# Patient Record
Sex: Male | Born: 1965 | Race: White | Hispanic: No | Marital: Single | Smoking: Current every day smoker
Health system: Southern US, Community
[De-identification: ages and names within clinical notes are randomized; demographics above are authoritative.]

## PROBLEM LIST (undated history)

## (undated) DIAGNOSIS — R531 Weakness: Secondary | ICD-10-CM

## (undated) DIAGNOSIS — Z8659 Personal history of other mental and behavioral disorders: Secondary | ICD-10-CM

## (undated) DIAGNOSIS — F1021 Alcohol dependence, in remission: Secondary | ICD-10-CM

## (undated) DIAGNOSIS — Z789 Other specified health status: Secondary | ICD-10-CM

## (undated) DIAGNOSIS — E43 Unspecified severe protein-calorie malnutrition: Secondary | ICD-10-CM

## (undated) DIAGNOSIS — I10 Essential (primary) hypertension: Secondary | ICD-10-CM

## (undated) DIAGNOSIS — R683 Clubbing of fingers: Secondary | ICD-10-CM

## (undated) DIAGNOSIS — R7989 Other specified abnormal findings of blood chemistry: Secondary | ICD-10-CM

## (undated) DIAGNOSIS — R251 Tremor, unspecified: Secondary | ICD-10-CM

## (undated) DIAGNOSIS — Z8719 Personal history of other diseases of the digestive system: Secondary | ICD-10-CM

## (undated) DIAGNOSIS — F1090 Alcohol use, unspecified, uncomplicated: Secondary | ICD-10-CM

## (undated) HISTORY — PX: TYMPANOSTOMY TUBE PLACEMENT: SHX32

---

## 2016-06-06 ENCOUNTER — Emergency Department: Payer: No Typology Code available for payment source

## 2016-06-06 ENCOUNTER — Encounter: Payer: Self-pay | Admitting: Emergency Medicine

## 2016-06-06 ENCOUNTER — Emergency Department
Admission: EM | Admit: 2016-06-06 | Discharge: 2016-06-06 | Disposition: A | Discharge status: Home / Self Care | Payer: No Typology Code available for payment source | Attending: Emergency Medicine | Admitting: Emergency Medicine

## 2016-06-06 DIAGNOSIS — S60221A Contusion of right hand, initial encounter: Secondary | ICD-10-CM

## 2016-06-06 DIAGNOSIS — Y999 Unspecified external cause status: Secondary | ICD-10-CM | POA: Insufficient documentation

## 2016-06-06 DIAGNOSIS — S20212A Contusion of left front wall of thorax, initial encounter: Secondary | ICD-10-CM | POA: Diagnosis not present

## 2016-06-06 DIAGNOSIS — S161XXA Strain of muscle, fascia and tendon at neck level, initial encounter: Secondary | ICD-10-CM | POA: Diagnosis not present

## 2016-06-06 DIAGNOSIS — S199XXA Unspecified injury of neck, initial encounter: Secondary | ICD-10-CM | POA: Diagnosis present

## 2016-06-06 DIAGNOSIS — Y92410 Unspecified street and highway as the place of occurrence of the external cause: Secondary | ICD-10-CM | POA: Diagnosis not present

## 2016-06-06 DIAGNOSIS — Y9389 Activity, other specified: Secondary | ICD-10-CM | POA: Diagnosis not present

## 2016-06-06 DIAGNOSIS — S40022A Contusion of left upper arm, initial encounter: Secondary | ICD-10-CM

## 2016-06-06 MED ORDER — HYDROCODONE-ACETAMINOPHEN 5-325 MG PO TABS: 1.0000 | ORAL_TABLET | Freq: Four times a day (QID) | ORAL | 0 refills | Status: DC | PRN

## 2016-06-06 MED ORDER — BACITRACIN ZINC 500 UNIT/GM EX OINT
TOPICAL_OINTMENT | CUTANEOUS | Status: AC
  Filled 2016-06-06: qty 0.9

## 2016-06-06 MED ORDER — IBUPROFEN 800 MG PO TABS: 800.0000 mg | ORAL_TABLET | Freq: Three times a day (TID) | ORAL | 0 refills | Status: DC | PRN

## 2016-06-06 MED ORDER — CYCLOBENZAPRINE HCL 5 MG PO TABS: 5.0000 mg | ORAL_TABLET | Freq: Three times a day (TID) | ORAL | 0 refills | Status: DC | PRN

## 2016-06-06 MED ORDER — BACITRACIN ZINC 500 UNIT/GM EX OINT
TOPICAL_OINTMENT | Freq: Two times a day (BID) | CUTANEOUS | Status: DC
Start: 2016-06-06 — End: 2016-06-06
  Administered 2016-06-06: 1 via TOPICAL

## 2016-06-06 MED ORDER — HYDROCODONE-ACETAMINOPHEN 5-325 MG PO TABS
1.0000 | ORAL_TABLET | ORAL | Status: AC
  Administered 2016-06-06: 1 via ORAL
  Filled 2016-06-06: qty 1
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
## 2016-06-06 NOTE — Discharge Instructions (Signed)
Please wear sling for 2-3 days. Take ibuprofen, Flexeril as needed for mild to moderate pain. Take Norco as needed for severe pain. Return to the ER for any increased swelling pain, numbness, tingling throughout the upper extremity. Return to the ER for any worsening symptoms urgent changes in her health.
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
## 2016-07-05 ENCOUNTER — Encounter: Payer: Self-pay | Admitting: Emergency Medicine

## 2016-07-05 ENCOUNTER — Emergency Department
Admission: EM | Admit: 2016-07-05 | Discharge: 2016-07-05 | Disposition: A | Discharge status: Home / Self Care | Payer: No Typology Code available for payment source | Attending: Emergency Medicine | Admitting: Emergency Medicine

## 2016-07-05 DIAGNOSIS — Z791 Long term (current) use of non-steroidal anti-inflammatories (NSAID): Secondary | ICD-10-CM | POA: Insufficient documentation

## 2016-07-05 DIAGNOSIS — S4992XD Unspecified injury of left shoulder and upper arm, subsequent encounter: Secondary | ICD-10-CM | POA: Diagnosis present

## 2016-07-05 DIAGNOSIS — S40022D Contusion of left upper arm, subsequent encounter: Secondary | ICD-10-CM | POA: Insufficient documentation

## 2016-07-05 DIAGNOSIS — F17210 Nicotine dependence, cigarettes, uncomplicated: Secondary | ICD-10-CM | POA: Insufficient documentation

## 2016-07-05 MED ORDER — NABUMETONE 750 MG PO TABS: 750.0000 mg | ORAL_TABLET | Freq: Two times a day (BID) | ORAL | 0 refills | Status: DC

## 2016-07-05 NOTE — Discharge Instructions (Signed)
Your exam and review of your previous CT scan and x-rays reveals a small, resolving hematoma to the upper left arm. This happens after a deep muscle bruise. It can take several weeks for a deep muscle bruise and hematoma to go away. Apply warm compresses to promote healing. Take the prescription anti-inflammatory pain medicine as directed. Follow-up with Dr. Joice LoftsPoggi for continued symptoms.

## 2016-07-05 NOTE — ED Notes (Signed)
Pt states he used to be BP meds xfew years ago but hasn't been on any since.
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
## 2022-11-08 ENCOUNTER — Encounter: Payer: Self-pay | Admitting: Radiology

## 2022-11-08 ENCOUNTER — Inpatient Hospital Stay
Admission: EM | Admit: 2022-11-08 | Discharge: 2022-11-14 | DRG: 896 | Disposition: A | Discharge status: Home Health | Payer: 59 | Attending: Internal Medicine | Admitting: Internal Medicine

## 2022-11-08 ENCOUNTER — Inpatient Hospital Stay: Payer: 59

## 2022-11-08 ENCOUNTER — Emergency Department: Payer: 59

## 2022-11-08 DIAGNOSIS — R251 Tremor, unspecified: Secondary | ICD-10-CM | POA: Diagnosis not present

## 2022-11-08 DIAGNOSIS — F10931 Alcohol use, unspecified with withdrawal delirium: Secondary | ICD-10-CM

## 2022-11-08 DIAGNOSIS — Z87891 Personal history of nicotine dependence: Secondary | ICD-10-CM

## 2022-11-08 DIAGNOSIS — Z6825 Body mass index (BMI) 25.0-25.9, adult: Secondary | ICD-10-CM

## 2022-11-08 DIAGNOSIS — L659 Nonscarring hair loss, unspecified: Secondary | ICD-10-CM | POA: Diagnosis present

## 2022-11-08 DIAGNOSIS — R683 Clubbing of fingers: Secondary | ICD-10-CM | POA: Insufficient documentation

## 2022-11-08 DIAGNOSIS — R748 Abnormal levels of other serum enzymes: Secondary | ICD-10-CM | POA: Diagnosis not present

## 2022-11-08 DIAGNOSIS — R531 Weakness: Principal | ICD-10-CM

## 2022-11-08 DIAGNOSIS — Y906 Blood alcohol level of 120-199 mg/100 ml: Secondary | ICD-10-CM | POA: Diagnosis present

## 2022-11-08 DIAGNOSIS — F1010 Alcohol abuse, uncomplicated: Secondary | ICD-10-CM | POA: Diagnosis not present

## 2022-11-08 DIAGNOSIS — G312 Degeneration of nervous system due to alcohol: Secondary | ICD-10-CM | POA: Diagnosis not present

## 2022-11-08 DIAGNOSIS — G9341 Metabolic encephalopathy: Secondary | ICD-10-CM | POA: Diagnosis present

## 2022-11-08 DIAGNOSIS — R0602 Shortness of breath: Secondary | ICD-10-CM | POA: Diagnosis not present

## 2022-11-08 DIAGNOSIS — R4182 Altered mental status, unspecified: Secondary | ICD-10-CM | POA: Diagnosis present

## 2022-11-08 DIAGNOSIS — E43 Unspecified severe protein-calorie malnutrition: Secondary | ICD-10-CM | POA: Insufficient documentation

## 2022-11-08 DIAGNOSIS — Z79899 Other long term (current) drug therapy: Secondary | ICD-10-CM

## 2022-11-08 DIAGNOSIS — M5000 Cervical disc disorder with myelopathy, unspecified cervical region: Secondary | ICD-10-CM | POA: Diagnosis not present

## 2022-11-08 DIAGNOSIS — R260 Ataxic gait: Secondary | ICD-10-CM | POA: Diagnosis not present

## 2022-11-08 DIAGNOSIS — E559 Vitamin D deficiency, unspecified: Secondary | ICD-10-CM | POA: Diagnosis present

## 2022-11-08 DIAGNOSIS — R1310 Dysphagia, unspecified: Secondary | ICD-10-CM | POA: Diagnosis not present

## 2022-11-08 DIAGNOSIS — R630 Anorexia: Secondary | ICD-10-CM | POA: Diagnosis present

## 2022-11-08 DIAGNOSIS — K760 Fatty (change of) liver, not elsewhere classified: Secondary | ICD-10-CM | POA: Diagnosis not present

## 2022-11-08 DIAGNOSIS — I10 Essential (primary) hypertension: Secondary | ICD-10-CM | POA: Diagnosis not present

## 2022-11-08 DIAGNOSIS — G4700 Insomnia, unspecified: Secondary | ICD-10-CM | POA: Diagnosis not present

## 2022-11-08 DIAGNOSIS — R079 Chest pain, unspecified: Secondary | ICD-10-CM | POA: Diagnosis not present

## 2022-11-08 DIAGNOSIS — L631 Alopecia universalis: Secondary | ICD-10-CM | POA: Diagnosis not present

## 2022-11-08 DIAGNOSIS — K7010 Alcoholic hepatitis without ascites: Secondary | ICD-10-CM | POA: Diagnosis present

## 2022-11-08 DIAGNOSIS — G8191 Hemiplegia, unspecified affecting right dominant side: Secondary | ICD-10-CM | POA: Diagnosis not present

## 2022-11-08 DIAGNOSIS — F1021 Alcohol dependence, in remission: Secondary | ICD-10-CM

## 2022-11-08 DIAGNOSIS — G629 Polyneuropathy, unspecified: Secondary | ICD-10-CM | POA: Diagnosis not present

## 2022-11-08 DIAGNOSIS — F10231 Alcohol dependence with withdrawal delirium: Principal | ICD-10-CM | POA: Diagnosis present

## 2022-11-08 DIAGNOSIS — Z789 Other specified health status: Secondary | ICD-10-CM | POA: Insufficient documentation

## 2022-11-08 DIAGNOSIS — R5383 Other fatigue: Secondary | ICD-10-CM | POA: Diagnosis not present

## 2022-11-08 DIAGNOSIS — R17 Unspecified jaundice: Secondary | ICD-10-CM | POA: Diagnosis not present

## 2022-11-08 DIAGNOSIS — M545 Low back pain, unspecified: Secondary | ICD-10-CM | POA: Diagnosis not present

## 2022-11-08 HISTORY — DX: Other specified health status: Z78.9

## 2022-11-08 HISTORY — DX: Alcohol use, unspecified, uncomplicated: F10.90

## 2022-11-08 LAB — URINE DRUG SCREEN, QUALITATIVE (ARMC ONLY)
Amphetamines, Ur Screen: NOT DETECTED
Barbiturates, Ur Screen: NOT DETECTED
Benzodiazepine, Ur Scrn: NOT DETECTED
Cannabinoid 50 Ng, Ur ~~LOC~~: POSITIVE — AB
Cocaine Metabolite,Ur ~~LOC~~: NOT DETECTED
MDMA (Ecstasy)Ur Screen: NOT DETECTED
Methadone Scn, Ur: NOT DETECTED
Opiate, Ur Screen: NOT DETECTED
Phencyclidine (PCP) Ur S: NOT DETECTED
Tricyclic, Ur Screen: NOT DETECTED

## 2022-11-08 LAB — URINALYSIS, ROUTINE W REFLEX MICROSCOPIC
Bacteria, UA: NONE SEEN
Bilirubin Urine: NEGATIVE
Glucose, UA: NEGATIVE mg/dL
Ketones, ur: 5 mg/dL — AB
Leukocytes,Ua: NEGATIVE
Nitrite: NEGATIVE
Protein, ur: 30 mg/dL — AB
Specific Gravity, Urine: 1.027 (ref 1.005–1.030)
Squamous Epithelial / HPF: NONE SEEN /HPF (ref 0–5)
pH: 5 (ref 5.0–8.0)

## 2022-11-08 LAB — BASIC METABOLIC PANEL
Anion gap: 15 (ref 5–15)
BUN: 9 mg/dL (ref 6–20)
CO2: 21 mmol/L — ABNORMAL LOW (ref 22–32)
Calcium: 8.8 mg/dL — ABNORMAL LOW (ref 8.9–10.3)
Chloride: 102 mmol/L (ref 98–111)
Creatinine, Ser: 0.65 mg/dL (ref 0.61–1.24)
GFR, Estimated: >60 mL/min (ref >60)
Glucose, Bld: 94 mg/dL (ref 70–99)
Potassium: 4.2 mmol/L (ref 3.5–5.1)
Sodium: 138 mmol/L (ref 135–145)

## 2022-11-08 LAB — CBC
HCT: 45.8 % (ref 39.0–52.0)
Hemoglobin: 15.4 g/dL (ref 13.0–17.0)
MCH: 33.7 pg (ref 26.0–34.0)
MCHC: 33.6 g/dL (ref 30.0–36.0)
MCV: 100.2 fL — ABNORMAL HIGH (ref 80.0–100.0)
Platelets: 283 10*3/uL (ref 150–400)
RBC: 4.57 MIL/uL (ref 4.22–5.81)
RDW: 14.6 % (ref 11.5–15.5)
WBC: 6.7 10*3/uL (ref 4.0–10.5)
nRBC: 0 % (ref 0.0–0.2)

## 2022-11-08 LAB — HEPATIC FUNCTION PANEL
ALT: 85 U/L — ABNORMAL HIGH (ref 0–44)
AST: 114 U/L — ABNORMAL HIGH (ref 15–41)
Albumin: 4.6 g/dL (ref 3.5–5.0)
Alkaline Phosphatase: 76 U/L (ref 38–126)
Bilirubin, Direct: 0.3 mg/dL — ABNORMAL HIGH (ref 0.0–0.2)
Indirect Bilirubin: 0.8 mg/dL (ref 0.3–0.9)
Total Bilirubin: 1.1 mg/dL (ref 0.3–1.2)
Total Protein: 8.4 g/dL — ABNORMAL HIGH (ref 6.5–8.1)

## 2022-11-08 LAB — RETICULOCYTES
Immature Retic Fract: 8.6 % (ref 2.3–15.9)
RBC.: 4.01 MIL/uL — ABNORMAL LOW (ref 4.22–5.81)
Retic Count, Absolute: 89 10*3/uL (ref 19.0–186.0)
Retic Ct Pct: 2.2 % (ref 0.4–3.1)

## 2022-11-08 LAB — T4, FREE: Free T4: 0.53 ng/dL — ABNORMAL LOW (ref 0.61–1.12)

## 2022-11-08 LAB — TROPONIN I (HIGH SENSITIVITY): Troponin I (High Sensitivity): 15 ng/L (ref <18)

## 2022-11-08 LAB — FOLATE: Folate: 15.5 ng/mL (ref >5.9)

## 2022-11-08 LAB — AMMONIA: Ammonia: 29 umol/L (ref 9–35)

## 2022-11-08 LAB — PHOSPHORUS: Phosphorus: 3.8 mg/dL (ref 2.5–4.6)

## 2022-11-08 LAB — TSH: TSH: 2.097 u[IU]/mL (ref 0.350–4.500)

## 2022-11-08 LAB — CK: Total CK: 101 U/L (ref 49–397)

## 2022-11-08 LAB — VITAMIN B12: Vitamin B-12: 375 pg/mL (ref 180–914)

## 2022-11-08 LAB — IRON AND TIBC
Iron: 119 ug/dL (ref 45–182)
Saturation Ratios: 29 % (ref 17.9–39.5)
TIBC: 407 ug/dL (ref 250–450)
UIBC: 288 ug/dL

## 2022-11-08 LAB — FERRITIN: Ferritin: 126 ng/mL (ref 24–336)

## 2022-11-08 LAB — ETHANOL: Alcohol, Ethyl (B): 195 mg/dL — ABNORMAL HIGH (ref <10)

## 2022-11-08 LAB — MAGNESIUM: Magnesium: 2.2 mg/dL (ref 1.7–2.4)

## 2022-11-08 MED ORDER — IOHEXOL 350 MG/ML SOLN
75.0000 mL | Freq: Once | INTRAVENOUS | Status: AC | PRN
  Administered 2022-11-08: 75 mL via INTRAVENOUS

## 2022-11-08 MED ORDER — LORAZEPAM 1 MG PO TABS
1.0000 mg | ORAL_TABLET | ORAL | Status: AC | PRN
  Administered 2022-11-09: 2 mg via ORAL
  Administered 2022-11-10 – 2022-11-11 (×4): 1 mg via ORAL
  Filled 2022-11-08: qty 1
  Filled 2022-11-08: qty 2
  Filled 2022-11-08 (×3): qty 1

## 2022-11-08 MED ORDER — GADOBUTROL 1 MMOL/ML IV SOLN
7.0000 mL | Freq: Once | INTRAVENOUS | Status: AC | PRN
  Administered 2022-11-08: 7 mL via INTRAVENOUS

## 2022-11-08 MED ORDER — ENOXAPARIN SODIUM 40 MG/0.4ML IJ SOSY
40.0000 mg | PREFILLED_SYRINGE | INTRAMUSCULAR | Status: DC
  Administered 2022-11-08 – 2022-11-13 (×6): 40 mg via SUBCUTANEOUS
  Filled 2022-11-08 (×6): qty 0.4

## 2022-11-08 MED ORDER — THIAMINE HCL 100 MG/ML IJ SOLN
500.0000 mg | Freq: Once | INTRAVENOUS | Status: AC
  Administered 2022-11-08: 500 mg via INTRAVENOUS
  Filled 2022-11-08: qty 5

## 2022-11-08 MED ORDER — ADULT MULTIVITAMIN W/MINERALS CH
1.0000 | ORAL_TABLET | Freq: Every day | ORAL | Status: DC
  Administered 2022-11-08 – 2022-11-14 (×7): 1 via ORAL
  Filled 2022-11-08 (×7): qty 1

## 2022-11-08 MED ORDER — LORAZEPAM 2 MG/ML IJ SOLN
1.0000 mg | Freq: Once | INTRAMUSCULAR | Status: AC
  Administered 2022-11-08: 1 mg via INTRAVENOUS
  Filled 2022-11-08: qty 1

## 2022-11-08 MED ORDER — THIAMINE MONONITRATE 100 MG PO TABS
100.0000 mg | ORAL_TABLET | Freq: Every day | ORAL | Status: DC
  Administered 2022-11-09: 100 mg via ORAL
  Filled 2022-11-08: qty 1

## 2022-11-08 MED ORDER — SENNOSIDES-DOCUSATE SODIUM 8.6-50 MG PO TABS: 1.0000 | ORAL_TABLET | Freq: Every evening | ORAL | Status: DC | PRN

## 2022-11-08 MED ORDER — ACETAMINOPHEN 650 MG RE SUPP: 650.0000 mg | Freq: Four times a day (QID) | RECTAL | Status: AC | PRN

## 2022-11-08 MED ORDER — ONDANSETRON HCL 4 MG PO TABS
4.0000 mg | ORAL_TABLET | Freq: Four times a day (QID) | ORAL | Status: AC | PRN
  Administered 2022-11-10 – 2022-11-12 (×5): 4 mg via ORAL
  Filled 2022-11-08 (×5): qty 1

## 2022-11-08 MED ORDER — ONDANSETRON HCL 4 MG/2ML IJ SOLN
4.0000 mg | Freq: Four times a day (QID) | INTRAMUSCULAR | Status: AC | PRN
  Administered 2022-11-10 – 2022-11-13 (×2): 4 mg via INTRAVENOUS
  Filled 2022-11-08 (×2): qty 2

## 2022-11-08 MED ORDER — CYANOCOBALAMIN 1000 MCG/ML IJ SOLN
1000.0000 ug | Freq: Once | INTRAMUSCULAR | Status: AC
  Administered 2022-11-08: 1000 ug via INTRAMUSCULAR
  Filled 2022-11-08: qty 1

## 2022-11-08 MED ORDER — LORAZEPAM 2 MG/ML IJ SOLN
1.0000 mg | INTRAMUSCULAR | Status: AC | PRN
  Administered 2022-11-08: 2 mg via INTRAVENOUS
  Administered 2022-11-08 – 2022-11-10 (×2): 1 mg via INTRAVENOUS
  Filled 2022-11-08 (×3): qty 1

## 2022-11-08 MED ORDER — THIAMINE HCL 100 MG/ML IJ SOLN: 100.0000 mg | Freq: Every day | INTRAMUSCULAR | Status: DC

## 2022-11-08 MED ORDER — VITAMIN C 500 MG PO TABS
250.0000 mg | ORAL_TABLET | Freq: Two times a day (BID) | ORAL | Status: DC
  Administered 2022-11-08 – 2022-11-14 (×12): 250 mg via ORAL
  Filled 2022-11-08 (×12): qty 1

## 2022-11-08 MED ORDER — SODIUM CHLORIDE 0.9 % IV BOLUS
1000.0000 mL | Freq: Once | INTRAVENOUS | Status: AC
  Administered 2022-11-08: 1000 mL via INTRAVENOUS

## 2022-11-08 MED ORDER — ACETAMINOPHEN 325 MG PO TABS
650.0000 mg | ORAL_TABLET | Freq: Four times a day (QID) | ORAL | Status: AC | PRN
  Administered 2022-11-09 – 2022-11-13 (×7): 650 mg via ORAL
  Filled 2022-11-08 (×7): qty 2

## 2022-11-08 MED ORDER — FOLIC ACID 1 MG PO TABS
1.0000 mg | ORAL_TABLET | Freq: Every day | ORAL | Status: DC
  Administered 2022-11-08 – 2022-11-14 (×7): 1 mg via ORAL
  Filled 2022-11-08 (×7): qty 1

## 2022-11-08 NOTE — ED Notes (Signed)
Pt almost finished with dinner tray. Confirms has answered MRI screening questions.

## 2022-11-08 NOTE — Hospital Course (Addendum)
[name] is a 57 year old male with history of generalized muscle weakness, tremor, who presents emergency department for chief concerns of worsening tremors and complete hair loss over the last month.  Tremor was worse on the right side with some weakness.  MRI of the brain did not show any acute changes.  MRI neck did not show any cervical stenosis. 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 Patient also has been seen by neurology for right sided weakness, given total hair loss, workup started. 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  Can discharge and follow-up with neurology as outpatient.  However, due to severe weakness, PT recommended nursing home placement.  4/10.  Patient's strength has gradually improved.  Only 1 rehab accepted him as a patient but the insurance does not cover that rehab.  Patient has decided to go home with home health.  Less tremor at this point.  Patient knows he must stop alcohol.

## 2022-11-08 NOTE — H&P (Addendum)
History and Physical   Lee Mueller [id number] DOB: 1966/08/05 DOA: 11/08/2022  PCP: Pcp, No  Patient coming from: [facility] urgent care  I have personally briefly reviewed patient's old medical records in [place].  Chief Concern: Right-sided weakness, insomnia, complete hair loss  HPI: [name] is a 57 year old male with history of generalized muscle weakness, tremor, who presents emergency department for chief concerns of worsening tremors and complete hair loss over the last month.  Vitals show temperature of 98.1, respiration rate of 17, heart rate 88, blood pressure 184/105, SpO2 of 95% on room air.  Serum sodium is 138, potassium 4.2, chloride 102, bicarb 21, BUN of 9, serum creatinine of 0.65, EGFR greater than 60, nonfasting blood glucose 95, WBC 6.7, hemoglobin 15.4, platelets of 283.  High sensitive troponin is 15, folic acid level was 0000000.  Patient had AST levels of 114, ALT is 85.  EDP consulted neurology who recommends checking ammonia, vitamin D, folate, B1 level, B12 level, TSH and free T4 and adding a hepatic function panel.  ED treatment: Ativan 1 mg IV one-time dose, sodium chloride 1 L bolus, thiamine 500 mg IV one-time dose. --------------------- At bedside, patient was able to tell me his name, age, current calendar year and current location.  Patient is completely hairless and there is no hair on his arms, legs, head.  He has no eyebrows.  Ports that over the last 2 weeks he has had difficulty walking, with right-sided weakness upper and lower extremities.  He reports he can walk but he is limping.  He denies trauma to his person.  In addition, he states that over the last month he has had diffuse hair loss along with the end of his digit widening.  He reports over the last month he has had poor p.o. intake, lack of appetite.  He reports that he may have lost about 5 to 6 pounds over the last month.  He does not know if he is lost weight  over the last 6 months or year.  He denies any changes to his job and/or living situation.  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  He currently lives in his apartment complex for the last 5 years.  He states he compartment complex may have been 57 years old.  He denies any significant changes over the last 6 months.  Social history: He lives alone.  He denies tobacco and recreational drug use.  He drinks beer almost every day, 1-2 beers per day.  He states they are 12 ounce bottles.  ROS: Constitutional: + weight change, no fever, + hair loss ENT/Mouth: no sore throat, no rhinorrhea Eyes: no eye pain, no vision changes Cardiovascular: no chest pain, no dyspnea,  no edema, no palpitations Respiratory: no cough, no sputum, no wheezing Gastrointestinal: no nausea, no vomiting, no diarrhea, no constipation Genitourinary: no urinary incontinence, no dysuria, no hematuria Musculoskeletal: no arthralgias, no myalgias Skin: no skin lesions, no pruritus, clubbing of his fingers Neuro: + weakness (R> L) eyes normal, no loss of consciousness, no syncope Psych: no anxiety, no depression, + decrease appetite Heme/Lymph: no bruising, no bleeding  ED Course: Discussed with emergency medicine provider, patient requiring hospitalization for chief concerns of altered mental status, tremors, complete hair loss and clubbing of the digits.  Assessment/Plan  Principal Problem:   Altered mental status Active Problems:   Complete loss of hair  Hair loss   Finger clubbing   Alcohol use   Elevated liver enzymes   Tremor of both hands   Assessment and Plan:  * Altered mental status Etiology workup in progress with tremors CT abdomen and pelvis with contrast and CTA of the chest to assess for PE were negative for lymphadenopathy and remarkable for hepatic steatosis Checking ammonia level, B1, [id number], folic acid level, vitamin D, UDS, ethanol  level Added phosphorus, vitamin D, lead level  Complete loss of hair Check for heavy metal poisoning, lead, copper, iron, ferritin level in the adult, vitamin D level, phosphorus Check free and total testosterone level  Tremor of both hands At rest Given daily alcohol use, we will initiate CIWA precaution  Elevated liver enzymes In setting of hepatic steatosis Patient has an approximate 2:1 ratio of AST: ALT, however given complete hair loss, heavy metal toxicity cannot be excluded at this time Check iron level, ferritin, copper, lead serum level LFTs recheck in the a.m.  Alcohol use With tremors, CIWA precaution initiated at this time Admit to PCU  Finger clubbing Check for heavy metal poisoning in an adult, lead level ordered  Chart reviewed.   DVT prophylaxis: Enoxaparin Code Status: full code Diet: Heart healthy Family Communication: A phone call was offered, patient declined Disposition Plan: Pending clinical course Consults called: EDP consulted neurology Admission status: Telemetry medical, inpatient  Past Medical History:  Diagnosis Date   Alcohol use    History reviewed. No pertinent surgical history.  Social History:  reports that he has quit smoking. His smoking use included cigarettes. He smoked an average of .5 packs per day. He has never used smokeless tobacco. He reports current alcohol use. He reports that he does not use drugs.  No Known Allergies Family History  Problem Relation Age of Onset   Breast cancer Mother    Family history: Family history reviewed and not pertinent.  Prior to Admission medications   Medication Sig Start Date End Date Taking? Authorizing Provider  cyclobenzaprine (FLEXERIL) 5 MG tablet Take 1-2 tablets (5-10 mg total) by mouth 3 (three) times daily as needed for muscle spasms. 06/06/16   Duanne Guess, PA-C  HYDROcodone-acetaminophen (NORCO) 5-325 MG tablet Take 1 tablet by mouth every 6 (six) hours as needed for  moderate pain. 06/06/16   Duanne Guess, PA-C  ibuprofen (ADVIL,MOTRIN) 800 MG tablet Take 1 tablet (800 mg total) by mouth every 8 (eight) hours as needed. 06/06/16   Duanne Guess, PA-C  nabumetone (RELAFEN) 750 MG tablet Take 1 tablet (750 mg total) by mouth 2 (two) times daily. 07/05/16   Menshew, Dannielle Karvonen, PA-C   Physical Exam: Vitals:   11/08/22 1800 11/08/22 1830 11/08/22 1901 11/08/22 1902  BP:  (!) 147/92    Pulse: 75 74  100  Resp: 18 16 19  (!) 21  Temp:      TempSrc:      SpO2: 97% 95%  99%  Weight:       Constitutional: appears older than chronological age, frail NAD, calm, comfortable. Patient is hairless Eyes: PERRL, lids and conjunctivae normal ENMT: Mucous membranes are moist. Posterior pharynx clear of any exudate or lesions. Age-appropriate dentition. Hearing appropriate Neck: normal, supple, no masses, no thyromegaly Respiratory: clear to auscultation bilaterally, no wheezing, no crackles. Normal respiratory effort. No accessory muscle use.  Cardiovascular: Regular rate and rhythm, no murmurs / rubs / gallops. No extremity edema. 2+ pedal pulses. No carotid bruits.  Diffuse clubbing of  his fingers  Abdomen: no tenderness, no masses palpated, no hepatosplenomegaly. Bowel sounds positive.  Musculoskeletal: no clubbing / cyanosis. No joint deformity upper and lower extremities. Good ROM, no contractures, no atrophy. Normal muscle tone.  Skin: no rashes, lesions, ulcers. No induration. Patient is hairless Neurologic: Sensation intact. Strength 5/5 in all 4.  Psychiatric: Normal judgment and insight. Alert and oriented x 3. Normal mood.   EKG: independently reviewed, showing sinus rhythm with rate of 78, QTc 444  Chest x-ray on Admission: I personally reviewed and I agree with radiologist reading as below.  CT Angio Chest PE W and/or Wo Contrast  Result Date: 11/08/2022 CLINICAL DATA:  Evaluation for new onset tremors, fatigue, muscle weakness, and difficulty  focusing for 4-6 weeks. PE suspected. Acute nonlocalized abdominal pain. EXAM: CT ANGIOGRAPHY CHEST CT ABDOMEN AND PELVIS WITH CONTRAST TECHNIQUE: Multidetector CT imaging of the chest was performed using the standard protocol during bolus administration of intravenous contrast. Multiplanar CT image reconstructions and MIPs were obtained to evaluate the vascular anatomy. Multidetector CT imaging of the abdomen and pelvis was performed using the standard protocol during bolus administration of intravenous contrast. RADIATION DOSE REDUCTION: This exam was performed according to the departmental dose-optimization program which includes automated exposure control, adjustment of the mA and/or kV according to patient size and/or use of iterative reconstruction technique. CONTRAST:  72mL OMNIPAQUE IOHEXOL 350 MG/ML SOLN COMPARISON:  Chest radiographs 11/08/2022 FINDINGS: CTA CHEST FINDINGS Cardiovascular: Satisfactory opacification of the pulmonary arteries to the segmental level. No evidence of pulmonary embolism. Normal heart size. No pericardial effusion. Mediastinum/Nodes: No enlarged mediastinal, hilar, or axillary lymph nodes. Thyroid gland, trachea, and esophagus demonstrate no significant findings. Lungs/Pleura: Mild emphysema in the upper lungs. No focal consolidation, pleural effusion, or pneumothorax. Biapical pleural-parenchymal scarring. Musculoskeletal: No chest wall abnormality. No acute osseous findings. Review of the MIP images confirms the above findings. CT ABDOMEN and PELVIS FINDINGS Hepatobiliary: Hepatic steatosis. Unremarkable gallbladder and biliary tree. Pancreas: Unremarkable. Spleen: Unremarkable. Adrenals/Urinary Tract: Normal adrenal glands. Low-attenuation lesions in the kidneys are statistically likely to represent cysts. No follow-up is required. No urinary calculi or hydronephrosis. Unremarkable bladder. Stomach/Bowel: Stomach is within normal limits. Appendix appears normal. No evidence of  bowel wall thickening, distention, or inflammatory changes. Vascular/Lymphatic: Aortic atherosclerosis. No enlarged abdominal or pelvic lymph nodes. Reproductive: Enlarged prostate. Other: No free intraperitoneal fluid or air. Musculoskeletal: No acute or significant osseous findings. IMPRESSION: 1. Negative for acute pulmonary embolism. 2. No acute abnormality in the chest, abdomen, or pelvis. 3. Hepatic steatosis. 4. Enlarged prostate. 5.  Aortic Atherosclerosis (ICD10-I70.0). Electronically Signed   By: Placido Sou M.D.   On: 11/08/2022 17:56   CT Abdomen Pelvis W Contrast  Result Date: 11/08/2022 CLINICAL DATA:  Evaluation for new onset tremors, fatigue, muscle weakness, and difficulty focusing for 4-6 weeks. PE suspected. Acute nonlocalized abdominal pain. EXAM: CT ANGIOGRAPHY CHEST CT ABDOMEN AND PELVIS WITH CONTRAST TECHNIQUE: Multidetector CT imaging of the chest was performed using the standard protocol during bolus administration of intravenous contrast. Multiplanar CT image reconstructions and MIPs were obtained to evaluate the vascular anatomy. Multidetector CT imaging of the abdomen and pelvis was performed using the standard protocol during bolus administration of intravenous contrast. RADIATION DOSE REDUCTION: This exam was performed according to the departmental dose-optimization program which includes automated exposure control, adjustment of the mA and/or kV according to patient size and/or use of iterative reconstruction technique. CONTRAST:  32mL OMNIPAQUE IOHEXOL 350 MG/ML SOLN COMPARISON:  Chest radiographs 11/08/2022 FINDINGS:  CTA CHEST FINDINGS Cardiovascular: Satisfactory opacification of the pulmonary arteries to the segmental level. No evidence of pulmonary embolism. Normal heart size. No pericardial effusion. Mediastinum/Nodes: No enlarged mediastinal, hilar, or axillary lymph nodes. Thyroid gland, trachea, and esophagus demonstrate no significant findings. Lungs/Pleura: Mild  emphysema in the upper lungs. No focal consolidation, pleural effusion, or pneumothorax. Biapical pleural-parenchymal scarring. Musculoskeletal: No chest wall abnormality. No acute osseous findings. Review of the MIP images confirms the above findings. CT ABDOMEN and PELVIS FINDINGS Hepatobiliary: Hepatic steatosis. Unremarkable gallbladder and biliary tree. Pancreas: Unremarkable. Spleen: Unremarkable. Adrenals/Urinary Tract: Normal adrenal glands. Low-attenuation lesions in the kidneys are statistically likely to represent cysts. No follow-up is required. No urinary calculi or hydronephrosis. Unremarkable bladder. Stomach/Bowel: Stomach is within normal limits. Appendix appears normal. No evidence of bowel wall thickening, distention, or inflammatory changes. Vascular/Lymphatic: Aortic atherosclerosis. No enlarged abdominal or pelvic lymph nodes. Reproductive: Enlarged prostate. Other: No free intraperitoneal fluid or air. Musculoskeletal: No acute or significant osseous findings. IMPRESSION: 1. Negative for acute pulmonary embolism. 2. No acute abnormality in the chest, abdomen, or pelvis. 3. Hepatic steatosis. 4. Enlarged prostate. 5.  Aortic Atherosclerosis (ICD10-I70.0). Electronically Signed   By: Placido Sou M.D.   On: 11/08/2022 17:56   DG Chest 2 View  Result Date: 11/08/2022 CLINICAL DATA:  CP, SOB EXAM: CHEST - 2 VIEW COMPARISON:  06/06/2016 FINDINGS: Cardiac silhouette is unremarkable. No pneumothorax or pleural effusion. The lungs are clear. The visualized skeletal structures are unremarkable. IMPRESSION: No acute cardiopulmonary process. Electronically Signed   By: Sammie Bench M.D.   On: 11/08/2022 16:49   CT Head Wo Contrast  Result Date: 11/08/2022 CLINICAL DATA:  Mental status change, unknown cause. EXAM: CT HEAD WITHOUT CONTRAST TECHNIQUE: Contiguous axial images were obtained from the base of the skull through the vertex without intravenous contrast. RADIATION DOSE REDUCTION: This  exam was performed according to the departmental dose-optimization program which includes automated exposure control, adjustment of the mA and/or kV according to patient size and/or use of iterative reconstruction technique. COMPARISON:  None Available. FINDINGS: Brain: No acute hemorrhage, mass effect or midline shift. Gray-white differentiation is preserved. No hydrocephalus. No extra-axial collection. Basilar cisterns are patent. Vascular: No hyperdense vessel or unexpected calcification. Skull: No calvarial fracture or suspicious bone lesion. Skull base is unremarkable. Sinuses/Orbits: Mild mucosal disease and trace fluid within the right maxillary sinus. Orbits are unremarkable. Mastoids are well aerated. Other: None. IMPRESSION: 1. No acute intracranial abnormality. 2. Mild mucosal disease and trace fluid within the right maxillary sinus. Correlate for signs/symptoms of acute sinusitis. Electronically Signed   By: Emmit Alexanders M.D.   On: 11/08/2022 16:07    Labs on Admission: I have personally reviewed following labs  CBC: Recent Labs  Lab 11/08/22 1430  WBC 6.7  HGB 15.4  HCT 45.8  MCV 100.2*  PLT [id number]   Basic Metabolic Panel: Recent Labs  Lab 11/08/22 1430  NA 138  K 4.2  CL 102  CO2 21*  GLUCOSE 94  BUN 9  CREATININE 0.65  CALCIUM 8.8*  MG 2.2   GFR: CrCl cannot be calculated (Unknown ideal weight.).  Liver Function Tests: Recent Labs  Lab 11/08/22 1551  AST 114*  ALT 85*  ALKPHOS 76  BILITOT 1.1  PROT 8.4*  ALBUMIN 4.6   Thyroid Function Tests: Recent Labs    11/08/22 1551  TSH 2.097  FREET4 0.53*   Anemia Panel: Recent Labs    11/08/22 1551  FOLATE 15.5   Urine  analysis:    Component Value Date/Time   COLORURINE YELLOW (A) 11/08/2022 1551   APPEARANCEUR CLEAR (A) 11/08/2022 1551   LABSPEC 1.027 11/08/2022 1551   PHURINE 5.0 11/08/2022 1551   GLUCOSEU NEGATIVE 11/08/2022 1551   HGBUR MODERATE (A) 11/08/2022 1551   BILIRUBINUR NEGATIVE  11/08/2022 1551   KETONESUR 5 (A) 11/08/2022 1551   PROTEINUR 30 (A) 11/08/2022 1551   NITRITE NEGATIVE 11/08/2022 1551   LEUKOCYTESUR NEGATIVE 11/08/2022 1551   CRITICAL CARE Performed by: Dr. Sedalia Muta  Total critical care time: 35 minutes  Critical care time was exclusive of separately billable procedures and treating other patients.  Critical care was necessary to treat or prevent imminent or life-threatening deterioration.  Critical care was time spent personally by me on the following activities: development of treatment plan with patient and/or surrogate as well as nursing, discussions with consultants, evaluation of patient's response to treatment, examination of patient, obtaining history from patient or surrogate, ordering and performing treatments and interventions, ordering and review of laboratory studies, ordering and review of radiographic studies, pulse oximetry and re-evaluation of patient's condition.  This document was prepared using Dragon Voice Recognition software and may include unintentional dictation errors.  Dr. Sedalia Muta [facility]  If 7PM-7AM, please contact overnight-coverage provider If 7AM-7PM, please contact day coverage provider [email or web address]  11/08/2022, 7:09 PM

## 2022-11-08 NOTE — ED Notes (Signed)
Pt not yet to room.  

## 2022-11-08 NOTE — ED Notes (Signed)
Pt back to room from imaging.

## 2022-11-08 NOTE — Assessment & Plan Note (Addendum)
Could be secondary to vitamin deficiency.

## 2022-11-08 NOTE — ED Notes (Signed)
Pt to room now. Will assess momentarily.  °

## 2022-11-08 NOTE — ED Notes (Signed)
Pt reports recent loss of hair, dec/no appetite, tremors, issues with fingernails and blurred vision.

## 2022-11-08 NOTE — ED Notes (Signed)
Pt left for imaging. Since SST (Vit E & B12) orders placed later than others tubes not yet collected. Will collect once pt back to room.

## 2022-11-08 NOTE — Assessment & Plan Note (Addendum)
Secondary to alcohol.  Total bilirubin 2.3, AST 85 and ALT 66

## 2022-11-08 NOTE — ED Notes (Signed)
Stretcher adjusted for pt. Pt given hospital phone as is having trouble with personal one getting connection currently.

## 2022-11-08 NOTE — ED Notes (Signed)
Will place pt on cardiac monitor once he his back to room.

## 2022-11-08 NOTE — Assessment & Plan Note (Deleted)
With tremors, CIWA precaution initiated at this time Admit to PCU

## 2022-11-08 NOTE — Assessment & Plan Note (Addendum)
-  Likely secondary to alcohol abuse. ?

## 2022-11-08 NOTE — Assessment & Plan Note (Addendum)
CT scan of the chest negative

## 2022-11-08 NOTE — ED Triage Notes (Addendum)
Pt sts that he has been shaking and is not in good shape. Pt also sts that he told his PCP that in the last few months that he has lost all of his body hair. Per pt, pcp sent him here to be seen.

## 2022-11-08 NOTE — ED Provider Notes (Signed)
[facility] Provider Note    Event Date/Time   First MD Initiated Contact with Patient 11/08/22 1511     (approximate)   History   Chief Complaint Shaking   HPI  Lee Mueller is a 57 y.o. male with past medical history of hypertension who presents to the ED complaining of tremor.  Patient reports that he has been dealing with increasing tremor, particularly on the right side of his body, over the past month.  He states that the tremor seems to get worse with any movement, also affects the left side of his body but is not as severe there.  He reports feeling slightly weak in his right arm and leg, has been able to walk and use his right arm but has difficulty doing so.  He endorses a cough as well as discomfort in the right upper quadrant of his abdomen and right lower part of his chest.  Pain seems to be worse when he takes a deep breath, but he denies any fevers.  He has noticed progressive hair loss across his entire body over the past month, along with poor appetite.  He admits to consuming a couple of beers daily, denies liquor consumption or drug use.  He initially presented to his PCPs office, was referred to the ED for further evaluation.  Patient states he is not currently taking any medication for his blood pressure.     Physical Exam   Triage Vital Signs: ED Triage Vitals  Enc Vitals Group     BP 11/08/22 1433 (!) 184/105     Pulse Rate 11/08/22 1433 88     Resp 11/08/22 1433 17     Temp 11/08/22 1433 98.1 F (36.7 C)     Temp Source 11/08/22 1433 Oral     SpO2 11/08/22 1433 95 %     Weight 11/08/22 1434 165 lb (74.8 kg)     Height --      Head Circumference --      Peak Flow --      Pain Score 11/08/22 1434 0     Pain Loc --      Pain Edu? --      Excl. in [place]? --     Most recent vital signs: Vitals:   11/08/22 1700 11/08/22 1800  BP: (!) 154/92   Pulse: 69 75  Resp: 20 18  Temp:    SpO2: 97% 97%    Constitutional: Alert and  oriented. Eyes: Conjunctivae are normal.  Pupils equal, round, and reactive to light bilaterally.  Extraocular movements intact without nystagmus. Head: Atraumatic. Nose: No congestion/rhinnorhea. Mouth/Throat: Mucous membranes are moist.  Cardiovascular: Normal rate, regular rhythm. Grossly normal heart sounds.  2+ radial pulses bilaterally. Respiratory: Normal respiratory effort.  No retractions. Lungs CTAB.  Right chest wall tenderness to palpation noted. Gastrointestinal: Soft and tender to palpation in the right upper quadrant with no rebound or guarding. No distention. Musculoskeletal: No lower extremity tenderness nor edema.  Hair loss noted across entire body with clubbing of fingertips. Neurologic:  Normal speech and language.  Diffuse tremor noted, worse with activity including tongue fasciculations.  5 out of 5 strength in left upper and lower extremities, 4 out of 5 strength in right upper and lower extremities.    ED Results / Procedures / Treatments   Labs (all labs ordered are listed, but only abnormal results are displayed) Labs Reviewed  BASIC METABOLIC PANEL - Abnormal; Notable for the following components:  Result Value   CO2 21 (*)    Calcium 8.8 (*)    All other components within normal limits  CBC - Abnormal; Notable for the following components:   MCV 100.2 (*)    All other components within normal limits  HEPATIC FUNCTION PANEL - Abnormal; Notable for the following components:   Total Protein 8.4 (*)    AST 114 (*)    ALT 85 (*)    Bilirubin, Direct 0.3 (*)    All other components within normal limits  T4, FREE - Abnormal; Notable for the following components:   Free T4 0.53 (*)    All other components within normal limits  TSH  FOLATE  MAGNESIUM  URINALYSIS, ROUTINE W REFLEX MICROSCOPIC  ETHANOL  VITAMIN B12  URINE DRUG SCREEN, QUALITATIVE ([facility] ONLY)  VITAMIN B1  VITAMIN E  AMMONIA  CBG MONITORING, ED  TROPONIN I (HIGH SENSITIVITY)      EKG  ED ECG REPORT I, Blake Divine, the attending physician, personally viewed and interpreted this ECG.   Date: 11/08/2022  EKG Time: 15:51  Rate: 78  Rhythm: normal sinus rhythm  Axis: Normal  Intervals:none  ST&T Change: None  RADIOLOGY Chest x-ray reviewed and interpreted by me with no infiltrate, edema, or effusion.  PROCEDURES:  Critical Care performed: No  Procedures   MEDICATIONS ORDERED IN ED: Medications  thiamine (VITAMIN B1) 500 mg in sodium chloride 0.9 % 50 mL IVPB (has no administration in time range)  sodium chloride 0.9 % bolus 1,000 mL (0 mLs Intravenous Stopped 11/08/22 1750)  iohexol (OMNIPAQUE) 350 MG/ML injection 75 mL (75 mLs Intravenous Contrast Given 11/08/22 1725)  LORazepam (ATIVAN) injection 1 mg (1 mg Intravenous Given 11/08/22 1815)     IMPRESSION / MDM / [place] / ED COURSE  I reviewed the triage vital signs and the nursing notes.                              57 y.o. male with past medical history of hypertension who presents to the ED complaining of 1 month of increasing tremor with activity, particularly on the right side and associated with weakness, right upper abdominal pain, decreased appetite, and hair loss.  Patient's presentation is most consistent with acute presentation with potential threat to life or bodily function.  Differential diagnosis includes, but is not limited to, stroke, MS, alcohol withdrawal, nutritional deficiency, AKI, electrolyte abnormality, hyperthyroidism, hypothyroidism, Warnicke's encephalopathy, malignancy.  Patient ill-appearing but in no acute distress, vital signs remarkable for elevated blood pressure but otherwise reassuring.  He has tremor diffusely but worse on the right side and worse with activity, also noted to be weak on the right side with no facial droop or speech symptoms.  EKG shows no evidence of arrhythmia or ischemia and initial labs show no significant anemia, leukocytosis,  electrode abnormality, or AKI.  His hair loss points more towards metabolic problem versus malignancy but given focal neurologic deficits will also need neurologic workup.  We will check CT head and chest x-ray, check additional labs including thyroid studies, thiamine, folate, magnesium, and hepatic function panel.  Case discussed with Dr. Rory Percy of neurology, who also recommends vitamin E level and proceeding with MRI with and without contrast of head and cervical spine if CT head is negative.  CT head is negative for acute process, chest x-ray also unremarkable.  CTA was performed of chest and negative for PE or other  acute process, CT of abdomen/pelvis is also unremarkable.  Additional labs show mild transaminitis but no significant elevation in bilirubin, this is likely secondary to chronic alcohol consumption.  Troponin within normal limits, thyroid studies also unremarkable.  We will further assess with MRI of his brain and cervical spine, for now treated with IV thiamine and a dose of Ativan.  Case discussed with hospitalist for admission.      FINAL CLINICAL IMPRESSION(S) / ED DIAGNOSES   Final diagnoses:  Right sided weakness  Tremor  Alcohol abuse  Complete loss of hair     Rx / DC Orders   ED Discharge Orders     None        Note:  This document was prepared using Dragon voice recognition software and may include unintentional dictation errors.   Blake Divine, MD 11/08/22 [phone]

## 2022-11-08 NOTE — Assessment & Plan Note (Deleted)
Etiology workup in progress with tremors CT abdomen and pelvis with contrast and CTA of the chest to assess for PE were negative for lymphadenopathy and remarkable for hepatic steatosis Checking ammonia level, B1, 123456, folic acid level, vitamin D, UDS, ethanol level Added phosphorus, vitamin D, lead level

## 2022-11-08 NOTE — ED Notes (Signed)
Pt reports doesn't think he can provide urine sample without cup of water or fluids. EDP Jessup notified in person.

## 2022-11-08 NOTE — ED Notes (Signed)
EDP Jessup at bedside.  

## 2022-11-08 NOTE — ED Notes (Signed)
Pt placed on cardiac monitor 

## 2022-11-08 NOTE — ED Triage Notes (Signed)
Sent from [facility] for ED evaluation for new onset tremors, fatigue, muscle weakness, and difficulty focusing x 4-6 weeks.

## 2022-11-09 ENCOUNTER — Inpatient Hospital Stay: Payer: 59

## 2022-11-09 DIAGNOSIS — L631 Alopecia universalis: Secondary | ICD-10-CM | POA: Diagnosis not present

## 2022-11-09 DIAGNOSIS — F10931 Alcohol use, unspecified with withdrawal delirium: Secondary | ICD-10-CM | POA: Diagnosis not present

## 2022-11-09 DIAGNOSIS — F1021 Alcohol dependence, in remission: Secondary | ICD-10-CM | POA: Insufficient documentation

## 2022-11-09 DIAGNOSIS — K7010 Alcoholic hepatitis without ascites: Secondary | ICD-10-CM | POA: Insufficient documentation

## 2022-11-09 DIAGNOSIS — F1010 Alcohol abuse, uncomplicated: Secondary | ICD-10-CM

## 2022-11-09 DIAGNOSIS — G629 Polyneuropathy, unspecified: Secondary | ICD-10-CM | POA: Diagnosis not present

## 2022-11-09 DIAGNOSIS — E559 Vitamin D deficiency, unspecified: Secondary | ICD-10-CM | POA: Insufficient documentation

## 2022-11-09 LAB — HEPATIC FUNCTION PANEL
ALT: 72 U/L — ABNORMAL HIGH (ref 0–44)
AST: 102 U/L — ABNORMAL HIGH (ref 15–41)
Albumin: 3.9 g/dL (ref 3.5–5.0)
Alkaline Phosphatase: 83 U/L (ref 38–126)
Bilirubin, Direct: 0.3 mg/dL — ABNORMAL HIGH (ref 0.0–0.2)
Indirect Bilirubin: 1.9 mg/dL — ABNORMAL HIGH (ref 0.3–0.9)
Total Bilirubin: 2.2 mg/dL — ABNORMAL HIGH (ref 0.3–1.2)
Total Protein: 7.7 g/dL (ref 6.5–8.1)

## 2022-11-09 LAB — BASIC METABOLIC PANEL
Anion gap: 13 (ref 5–15)
BUN: 12 mg/dL (ref 6–20)
CO2: 24 mmol/L (ref 22–32)
Calcium: 9 mg/dL (ref 8.9–10.3)
Chloride: 101 mmol/L (ref 98–111)
Creatinine, Ser: 0.81 mg/dL (ref 0.61–1.24)
GFR, Estimated: >60 mL/min (ref >60)
Glucose, Bld: 101 mg/dL — ABNORMAL HIGH (ref 70–99)
Potassium: 3.8 mmol/L (ref 3.5–5.1)
Sodium: 138 mmol/L (ref 135–145)

## 2022-11-09 LAB — CBC
HCT: 42.4 % (ref 39.0–52.0)
Hemoglobin: 14.1 g/dL (ref 13.0–17.0)
MCH: 33.5 pg (ref 26.0–34.0)
MCHC: 33.3 g/dL (ref 30.0–36.0)
MCV: 100.7 fL — ABNORMAL HIGH (ref 80.0–100.0)
Platelets: 235 10*3/uL (ref 150–400)
RBC: 4.21 MIL/uL — ABNORMAL LOW (ref 4.22–5.81)
RDW: 14.5 % (ref 11.5–15.5)
WBC: 6.3 10*3/uL (ref 4.0–10.5)
nRBC: 0 % (ref 0.0–0.2)

## 2022-11-09 LAB — HEPATITIS PANEL, ACUTE
HCV Ab: NONREACTIVE
Hep A IgM: NONREACTIVE
Hep B C IgM: NONREACTIVE
Hepatitis B Surface Ag: NONREACTIVE

## 2022-11-09 LAB — HIV ANTIBODY (ROUTINE TESTING W REFLEX): HIV Screen 4th Generation wRfx: NONREACTIVE

## 2022-11-09 LAB — VITAMIN D 25 HYDROXY (VIT D DEFICIENCY, FRACTURES): Vit D, 25-Hydroxy: 6.79 ng/mL — ABNORMAL LOW (ref 30–100)

## 2022-11-09 MED ORDER — VITAMIN D 25 MCG (1000 UNIT) PO TABS
1000.0000 [IU] | ORAL_TABLET | Freq: Every day | ORAL | Status: DC
  Administered 2022-11-09 – 2022-11-14 (×5): 1000 [IU] via ORAL
  Filled 2022-11-09 (×6): qty 1

## 2022-11-09 MED ORDER — THIAMINE HCL 100 MG/ML IJ SOLN: 100.0000 mg | Freq: Every day | INTRAMUSCULAR | Status: DC

## 2022-11-09 MED ORDER — VITAMIN B-12 1000 MCG PO TABS
1000.0000 ug | ORAL_TABLET | Freq: Every day | ORAL | Status: DC
  Administered 2022-11-09 – 2022-11-14 (×6): 1000 ug via ORAL
  Filled 2022-11-09: qty 1
  Filled 2022-11-09 (×2): qty 2
  Filled 2022-11-09 (×3): qty 1

## 2022-11-09 MED ORDER — CHLORDIAZEPOXIDE HCL 25 MG PO CAPS
25.0000 mg | ORAL_CAPSULE | Freq: Three times a day (TID) | ORAL | Status: DC
  Administered 2022-11-09 – 2022-11-11 (×7): 25 mg via ORAL
  Filled 2022-11-09 (×7): qty 1

## 2022-11-09 MED ORDER — THIAMINE HCL 100 MG/ML IJ SOLN
500.0000 mg | Freq: Three times a day (TID) | INTRAVENOUS | Status: AC
  Administered 2022-11-09 – 2022-11-11 (×6): 500 mg via INTRAVENOUS
  Filled 2022-11-09 (×7): qty 5

## 2022-11-09 MED ORDER — THIAMINE HCL 100 MG/ML IJ SOLN
250.0000 mg | Freq: Every day | INTRAVENOUS | Status: DC
  Administered 2022-11-11 – 2022-11-14 (×4): 250 mg via INTRAVENOUS
  Filled 2022-11-09 (×4): qty 2.5

## 2022-11-09 MED ORDER — NICOTINE 21 MG/24HR TD PT24
21.0000 mg | MEDICATED_PATCH | Freq: Every day | TRANSDERMAL | Status: DC
  Administered 2022-11-09 – 2022-11-14 (×5): 21 mg via TRANSDERMAL
  Filled 2022-11-09 (×6): qty 1

## 2022-11-09 NOTE — TOC Progression Note (Signed)
Transition of Care [facility]) - Progression Note    Patient Details  Name: Lee Mueller MRN: [id number] Date of Birth: [birth year]  [facility] CM/SW Contact  Garret Reddish, RN Phone Number: 11/09/2022, 12:39 PM  Clinical Narrative:   Offered Substance Abuse Resources and patient was agreeable.  I have placed Substance Abuse resources to patient discharge instructions. I have made staff nurse aware.           Expected Discharge Plan and Services                                               Social Determinants of Health (SDOH) Interventions SDOH Screenings   Tobacco Use: Medium Risk (11/08/2022)    Readmission Risk Interventions     No data to display

## 2022-11-09 NOTE — Consult Note (Addendum)
Neurology Consultation  Reason for Consult: Ataxia, tremors Referring Physician: Dr. Larinda ButteryJessup, EDP  CC: Tremors, difficulty walking, loss of all body hair  History is obtained from: Patient, chart  HPI: Lee Mueller is a 57 y.o. male past medical history of alcohol abuse, tobacco abuse, [facility] where he had presented with some weakness, insomnia, tremors and complete body hair loss. He reports that he has been having worsening tremors and has noticed that over the past 2 months has lost all the hair over his body.  He reports being a very heavy person at baseline but noticed over the past few months that he has absolutely no hair on his body including loss of hair on his head scalp and eyebrows.  He also reports somewhat reduced sweating.  He is a heavy smoker.  He also abuses alcohol.  Also describes severe gait ataxia that has been going on for the past couple of weeks.  Works [employer] for a company.  Lives in an apartment complex that is old but does not know if it has any lead paint or any other heavy metal issues. No exposure to radioactive substances.  Reports 5 pounds of weight loss in the past month.  Reports decreased appetite and also some dysphagia.  Case was discussed with me over the phone on call.  I recommended MRI brain and C-spine since there was weakness in the right arm and leg and as well as tremors-ordered to rule out demyelinating versus other structural lesions involving the brain or C-spine.  MRI brain and C-spine with and without contrast has largely been unremarkable.   ROS: Full ROS was performed and is negative except as noted in the HPI.   Past Medical History:  Diagnosis Date   Alcohol use      Family History  Problem Relation Age of Onset   Breast cancer Mother      Social History:   reports that he has quit smoking. His smoking use included cigarettes. He smoked an average of .5 packs per day. He has  never used smokeless tobacco. He reports current alcohol use. He reports that he does not use drugs.  Medications  Current Facility-Administered Medications:    acetaminophen (TYLENOL) tablet 650 mg, 650 mg, Oral, Q6H PRN, 650 mg at 11/09/22 0834 **OR** acetaminophen (TYLENOL) suppository 650 mg, 650 mg, Rectal, Q6H PRN, Cox, Amy N, DO   ascorbic acid (VITAMIN C) tablet 250 mg, 250 mg, Oral, BID, Cox, Amy N, DO, 250 mg at 11/09/22 [id number]   chlordiazePOXIDE (LIBRIUM) capsule 25 mg, 25 mg, Oral, TID, Marrion CoyZhang, Dekui, MD, 25 mg at 11/09/22 1119   cholecalciferol (VITAMIN D3) 25 MCG (1000 UNIT) tablet 1,000 Units, 1,000 Units, Oral, Daily, Marrion CoyZhang, Dekui, MD, 1,000 Units at 11/09/22 [id number]   cyanocobalamin (VITAMIN B12) tablet 1,000 mcg, 1,000 mcg, Oral, Daily, Marrion CoyZhang, Dekui, MD, 1,000 mcg at 11/09/22 0837   enoxaparin (LOVENOX) injection 40 mg, 40 mg, Subcutaneous, Q24H, Cox, Amy N, DO, 40 mg at 11/08/22 2139   folic acid (FOLVITE) tablet 1 mg, 1 mg, Oral, Daily, Cox, Amy N, DO, 1 mg at 11/09/22 0840   LORazepam (ATIVAN) tablet 1-4 mg, 1-4 mg, Oral, Q1H PRN, 2 mg at 11/09/22 0839 **OR** LORazepam (ATIVAN) injection 1-4 mg, 1-4 mg, Intravenous, Q1H PRN, Cox, Amy N, DO, 2 mg at 11/08/22 2318   multivitamin with minerals tablet 1 tablet, 1 tablet, Oral, Daily, Cox, Amy N, DO, 1 tablet at 11/09/22 [phone]  ondansetron (ZOFRAN) tablet 4 mg, 4 mg, Oral, Q6H PRN **OR** ondansetron (ZOFRAN) injection 4 mg, 4 mg, Intravenous, Q6H PRN, Cox, Amy N, DO   senna-docusate (Senokot-S) tablet 1 tablet, 1 tablet, Oral, QHS PRN, Cox, Amy N, DO   thiamine (VITAMIN B1) tablet 100 mg, 100 mg, Oral, Daily, 100 mg at 11/09/22 0838 **OR** thiamine (VITAMIN B1) injection 100 mg, 100 mg, Intravenous, Daily, Cox, Amy N, DO  Current Outpatient Medications:    cyclobenzaprine (FLEXERIL) 5 MG tablet, Take 1-2 tablets (5-10 mg total) by mouth 3 (three) times daily as needed for muscle spasms. (Patient not taking: Reported on 11/08/2022), Disp:  20 tablet, Rfl: 0   HYDROcodone-acetaminophen (NORCO) 5-325 MG tablet, Take 1 tablet by mouth every 6 (six) hours as needed for moderate pain. (Patient not taking: Reported on 11/08/2022), Disp: 10 tablet, Rfl: 0   ibuprofen (ADVIL,MOTRIN) 800 MG tablet, Take 1 tablet (800 mg total) by mouth every 8 (eight) hours as needed. (Patient not taking: Reported on 11/08/2022), Disp: 30 tablet, Rfl: 0   nabumetone (RELAFEN) 750 MG tablet, Take 1 tablet (750 mg total) by mouth 2 (two) times daily. (Patient not taking: Reported on 11/08/2022), Disp: 30 tablet, Rfl: 0  Exam: Current vital signs: BP 135/80   Pulse 68   Temp 99.3 F (37.4 C) (Oral)   Resp (!) 22   Wt 74.8 kg   SpO2 94%   BMI 25.09 kg/m  Vital signs in last 24 hours: Temp:  [98 F (36.7 C)-99.3 F (37.4 C)] 99.3 F (37.4 C) (04/05 0830) Pulse Rate:  [63-100] 68 (04/05 1000) Resp:  [16-25] 22 (04/05 1100) BP: (135-184)/(80-105) 135/80 (04/05 1100) SpO2:  [91 %-99 %] 94 % (04/05 1000) Weight:  [74.8 kg] 74.8 kg (04/04 1434)  General: Awake alert in no apparent distress HEENT: Loss of hair on the head face including eyebrows CVS: Regular rhythm Abdomen nondistended nontender Extremities warm well-perfused, significant clubbing bilaterally Neurological exam He is awake alert oriented x 3 Looks a little bit jittery, has not had a drink in a day or so. No dysarthria No aphasia Cranial nerves: Pupils equal round react light, extraocular movements intact, visual fields full, face appears symmetric, facial sensation intact, tongue and palate midline. Motor examination with generalized 4/5 strength proximally and 4+/5 strength distally in bilateral upper and lower extremities with very subtle asymmetry with right side being may be very subtly weaker than the left. Sensation diminished in the lower extremities in comparison to the upper extremities. Coordination: Dysmetria in both lower extremities.  Also generalized tremors all over.   Reportedly severely gait ataxic-I deferred gait examination for his comfort and safety  Labs I have reviewed labs in epic and the results pertinent to this consultation are:  CBC    Component Value Date/Time   WBC 6.3 11/09/2022 0408   RBC 4.21 (L) 11/09/2022 0408   HGB 14.1 11/09/2022 0408   HCT 42.4 11/09/2022 0408   PLT 235 11/09/2022 0408   MCV 100.7 (H) 11/09/2022 0408   MCH 33.5 11/09/2022 0408   MCHC 33.3 11/09/2022 0408   RDW 14.5 11/09/2022 0408    CMP     Component Value Date/Time   NA 138 11/09/2022 0408   K 3.8 11/09/2022 0408   CL 101 11/09/2022 0408   CO2 24 11/09/2022 0408   GLUCOSE 101 (H) 11/09/2022 0408   BUN 12 11/09/2022 0408   CREATININE 0.81 11/09/2022 0408   CALCIUM 9.0 11/09/2022 0408   PROT 7.7 11/09/2022  0408   ALBUMIN 3.9 11/09/2022 0408   AST 102 (H) 11/09/2022 0408   ALT 72 (H) 11/09/2022 0408   ALKPHOS 83 11/09/2022 0408   BILITOT 2.2 (H) 11/09/2022 0408   GFRNONAA >60 11/09/2022 0408    Imaging I have reviewed the images obtained:  MR brain with and without contrast, MRI C-spine with and without contrast-no acute intracranial pathology.  No abnormal enhancement.  Multilevel cervical spine degenerative disease with bilateral foraminal narrowing at C3-4 and on the right at C5-6 with mild spinal canal stenosis at C3-4, C4-5 and C5-6 Chest abdomen pelvis imaging negative for malignancy  Assessment: 57 year old past history of alcohol and tobacco abuse presenting for evaluation of tremulousness, gait ataxia, whole body hair loss.  Imaging reveals no significant brain or C-spine findings-this was done because initial report was more of right-sided versus left-sided weakness. On further interviewing him on further questioning, he reports that he has had trouble with both appetite and swallowing exhibiting some dysphagia.  On examination he has somewhat of more proximal than distal weakness symmetrically. This all could be related to alcoholic  myopathy and the hair loss also can be related to alcoholic liver disease which is blood work is suggestive of but other differentials would include heavy metal poisoning versus vitamin D deficiency.  Rarely, paraneoplastic process can also be in the picture but his CT of the chest abdomen pelvis is unremarkable for any evidence of malignancy.  Impression: Proximal weakness, ataxia, neuropathy-question alcoholic myopathy and neuropathy versus less likely an autoimmune or paraneoplastic neuropathy. Other differentials include toxic myopathy and neuropathy  Recommendations: I recommended checking multiple labs to include vitamin E, B12, thiamine, as well as TSH and ammonia. B12 is mildly low from a neurological standpoint at 375.  Would recommend parenteral replenishment. Heavy metal screen-ordered and pending. TSH is normal with low free T4.  Defer management to primary team Vitamin E drawn and pending CK was normal.  Less suggestive of myopathic process based on this. Serum paraneoplastic and autoimmune neuropathy panel ordeered ([email or web address]-profile) Outpatient EMG nerve conduction studies would be helpful as well Management of hyperbilirubinemia and liver function test abnormalities per primary team Low suspicion for this being related to any kind of radioactive exposure due to no history   Addendum Called for worsening right-sided weakness.  Examined the patient Mildly worse right-sided ataxia and right leg weakness.  Has significant amount of sensory ataxia on repeat exam. No new recommendations-plan as above.    -- Milon DikesAshish Keylor Rands, MD Neurologist [facility] Pager: [phone]

## 2022-11-09 NOTE — ED Notes (Signed)
Got pt up at bedside to use urinal. Pt very unsteady, worse than earlier today. Pt stated he feels like he can not use his right leg at all. Pt also stated that his right leg is worse than when the neurologist saw him this morning. This Clinical research associate made Elana, RN aware of this.

## 2022-11-09 NOTE — ED Notes (Incomplete)
This RN to bedside to check on pt. Pt. Attempting to stand at bedside to use urinal. Pt. Having gross motor tremors, and unable to stand. This RN instructed pt. To remain seated, and that he could use urinal in seated position. This RN assisted pt. In getting his pants and underwear off and pt. Was able to urinate sitting. Pt. Unable to

## 2022-11-09 NOTE — ED Notes (Signed)
Dr. Aurora at bedside

## 2022-11-09 NOTE — ED Notes (Signed)
Dr. Chipper Herb, and Dr. Jerrell Belfast notified of pt's c/o increased weakness to right side. Dr. Jerrell Belfast states he will come to bedside to assess pt, not to activate code stroke.

## 2022-11-09 NOTE — ED Notes (Signed)
Neurologist at bedside. 

## 2022-11-09 NOTE — ED Notes (Signed)
Pt's Daughter [place] [phone]) updated on pt's condition and POC. Phone brought to pt. To speak with daughter.

## 2022-11-09 NOTE — ED Notes (Signed)
US at bedside

## 2022-11-09 NOTE — ED Notes (Signed)
Pt requested a drink and som crackers. It was provided. Helped patient stand so he could use the urinal. Pt shaking. Could not be steady enough to hold the urinal himself. Pt voided . Pt back in bed with call bell within reach. No other needs at this time.

## 2022-11-09 NOTE — ED Notes (Signed)
Checked on pt. Pt is sleeping. Rise and fall of chest noted.

## 2022-11-09 NOTE — Progress Notes (Signed)
  Progress Note   Patient: Lee Mueller IDC:[id number] DOB: 1966/07/01 DOA: 11/08/2022     1 DOS: the patient was seen and examined on 11/09/2022   Brief hospital course: [name] is a 57 year old male with history of generalized muscle weakness, tremor, who presents emergency department for chief concerns of worsening tremors and complete hair loss over the last month.  Tremor was worse on the right side with some weakness.  MRI of the brain did not show any acute changes.  MRI neck did not show any cervical stenosis. 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   Principal Problem:   Altered mental status Active Problems:   Complete loss of hair   Hair loss   Finger clubbing   Alcohol use   Elevated liver enzymes   Tremor of both hands   Alcohol withdrawal delirium   Alcoholic hepatitis   Vitamin D deficiency   Assessment and Plan: * Altered mental status Alcohol withdrawal with delirium. Alcoholic hepatitis. Patient currently is in alcohol withdrawal, he has a severe tremor, mental status better today.  Continue CIWA protocol.  Also started scheduled Librium. Patient has elevated liver enzymes including bilirubin, consistent with alcoholic hepatitis.  Right upper quadrant ultrasound to rule out liver cirrhosis. Also check HIV and hepatitis panel.  Complete loss of hair Vitamin D deficiency. Heavy metal, lead, copper sent out, results pending. Iron, ferritin level normal.  Give vitamin D for vitamin D deficiency.Free and total testosterone level pending. B12 given for level of 375.  Finger clubbing CT chest with contrast did not show any lung mass.       Subjective:  Patient no longer has any confusion, still has significant tremor.  No nausea vomiting.  Physical Exam: Vitals:   11/09/22 0700 11/09/22 0745 11/09/22 0804 11/09/22 0830  BP: (!) 168/98  (!) 178/105   Pulse: 65 73 83   Resp: 17 20    Temp:    99.3 F (37.4 C)  TempSrc:    Oral   SpO2: 93% 96%    Weight:       General exam: Appears calm and comfortable  Respiratory system: Clear to auscultation. Respiratory effort normal. Cardiovascular system: S1 & S2 heard, RRR. No JVD, murmurs, rubs, gallops or clicks. No pedal edema. Gastrointestinal system: Abdomen is nondistended, soft and nontender. No organomegaly or masses felt. Normal bowel sounds heard. Central nervous system: Alert and oriented. No focal neurological deficits. Extremities: Tremor, most severe on the right side. Skin: No rashes, lesions or ulcers Psychiatry:  Mood & affect appropriate.    Data Reviewed:  Reviewed MRI results, CT scan results, lab results.  Family Communication: None  Disposition: Status is: Inpatient Remains inpatient appropriate because: Severity of disease, IV treatment, risk for worsening mental status.     Time spent: 50 minutes  Author: Marrion Coy, MD 11/09/2022 10:10 AM  For on call review [email or web address].

## 2022-11-09 NOTE — Discharge Instructions (Signed)
Of all the medications prescribe, norvasc (amlodipine, thiamine and vitamin D are the most important    Intensive Outpatient Programs   [facility] [address] [place],  [place], [place] [phone]  [facility] (Inpatient and outpatient)[phone] (Suboxone and Methadone) [address] [phone]  [facility]: [facility] Programs - Intensive Outpatient [address] [place], [place] [address]  [phone]  [facility] (Outpatient, Inpatient, [facility] (Groups and Residental) (insurance only) [phone] [place], [place] [phone]   [facility] (for caregivers and family) [address] Laurell Josephs [address] [place], [place] [phone]  Residential Treatment Programs  [facility](2 years) Residential: 63 days)[facility] [address] [place], [place], [place] [phone] or [phone]  [facility] [address] [place], [place], [place] [phone]  [facility] ([facility] [address] [place], [place], [place] [phone] Admissions: 8am-3pm M-F  [facility]: Residential Program [phone] Days)             [facility]: [facility]  [place], [place], [place]  [phone] or [phone] in Hours over the weekend or by referral)   M[facility]il Crisis: [facility]:[phone] (for crisis response 24 hours a day)    Some PCP options in [place] area- not a  comprehensive list  [facility]- [phone] [facility]- [phone] [facility]- [phone] [facility]- [phone] [facility]- [phone] [facility]- [phone]  or [facility] Health Physician Referral Line [phone]

## 2022-11-09 NOTE — ED Notes (Signed)
This Clinical research associate changed pt's bed sheets as they were wet. Also provided pt with toiletry supplies and assisted pt with brushing his teeth and washing face and hands.

## 2022-11-10 ENCOUNTER — Other Ambulatory Visit: Payer: Self-pay

## 2022-11-10 ENCOUNTER — Inpatient Hospital Stay: Payer: 59

## 2022-11-10 DIAGNOSIS — F1010 Alcohol abuse, uncomplicated: Secondary | ICD-10-CM | POA: Diagnosis not present

## 2022-11-10 DIAGNOSIS — R251 Tremor, unspecified: Secondary | ICD-10-CM | POA: Diagnosis not present

## 2022-11-10 DIAGNOSIS — L631 Alopecia universalis: Secondary | ICD-10-CM | POA: Diagnosis not present

## 2022-11-10 DIAGNOSIS — R531 Weakness: Principal | ICD-10-CM

## 2022-11-10 LAB — COMPREHENSIVE METABOLIC PANEL
ALT: 66 U/L — ABNORMAL HIGH (ref 0–44)
AST: 85 U/L — ABNORMAL HIGH (ref 15–41)
Albumin: 4.3 g/dL (ref 3.5–5.0)
Alkaline Phosphatase: 84 U/L (ref 38–126)
Anion gap: 12 (ref 5–15)
BUN: 13 mg/dL (ref 6–20)
CO2: 24 mmol/L (ref 22–32)
Calcium: 9.4 mg/dL (ref 8.9–10.3)
Chloride: 99 mmol/L (ref 98–111)
Creatinine, Ser: 0.67 mg/dL (ref 0.61–1.24)
GFR, Estimated: >60 mL/min (ref >60)
Glucose, Bld: 102 mg/dL — ABNORMAL HIGH (ref 70–99)
Potassium: 3.6 mmol/L (ref 3.5–5.1)
Sodium: 135 mmol/L (ref 135–145)
Total Bilirubin: 2.3 mg/dL — ABNORMAL HIGH (ref 0.3–1.2)
Total Protein: 7.9 g/dL (ref 6.5–8.1)

## 2022-11-10 LAB — PHOSPHORUS: Phosphorus: 4.1 mg/dL (ref 2.5–4.6)

## 2022-11-10 LAB — MAGNESIUM: Magnesium: 2.2 mg/dL (ref 1.7–2.4)

## 2022-11-10 MED ORDER — PANTOPRAZOLE SODIUM 40 MG PO TBEC
40.0000 mg | DELAYED_RELEASE_TABLET | Freq: Every day | ORAL | Status: DC
  Administered 2022-11-10 – 2022-11-14 (×5): 40 mg via ORAL
  Filled 2022-11-10 (×5): qty 1

## 2022-11-10 NOTE — Progress Notes (Signed)
  Progress Note   Patient: Lee Mueller [id number]:[id number] DOB: [birth year] DOA: 11/08/2022     2 DOS: the patient was seen and examined on 11/10/2022   Brief hospital course: [name] is a 57 year old male with history of generalized muscle weakness, tremor, who presents emergency department for chief concerns of worsening tremors and complete hair loss over the last month.  Tremor was worse on the right side with some weakness.  MRI of the brain did not show any acute changes.  MRI neck did not show any cervical stenosis. 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 Patient also has been seen by neurology for right sided weakness, given total hair loss, workup started.   Principal Problem:   Altered mental status Active Problems:   Complete loss of hair   Hair loss   Finger clubbing   Alcohol use   Elevated liver enzymes   Tremor of both hands   Alcohol withdrawal delirium   Alcoholic hepatitis   Vitamin D deficiency   Alcohol abuse   Assessment and Plan:  * Altered mental status Alcohol withdrawal with delirium. Alcoholic hepatitis. Patient currently is in alcohol withdrawal, he has a severe tremor, mental status better today.  Continue CIWA protocol.  Also started scheduled Librium. Patient has elevated liver enzymes including bilirubin, consistent with alcoholic hepatitis.  Right upper quadrant ultrasound showed hepatic steatosis consistent with alcohol hepatitis.  Hepatitis panel negative, HIV negative. Patient still has some tremor, no longer has any confusion.  Continue to follow closely.   Complete loss of hair Vitamin D deficiency. Right leg weakness. Heavy metal, lead, copper sent out, results pending. Iron, ferritin level normal.  Give vitamin D for vitamin D deficiency.Free and total testosterone level pending. B12 given for level of 375. Patient has been followed by nephrology, may consider LP study after completion of lab results.   Finger  clubbing CT chest with contrast did not show any lung mass.     Subjective:  Patient still complaining of right leg weakness and tremor.  No shortness of breath.  Physical Exam: Vitals:   11/09/22 2200 11/10/22 0150 11/10/22 0648 11/10/22 0940  BP:  123/87 (!) 176/113 (!) 164/108  Pulse: 61 (!) 58 74 88  Resp:  18 20 18   Temp:  98.1 F (36.7 C) 98.5 F (36.9 C) 98.1 F (36.7 C)  TempSrc:  Oral Oral Oral  SpO2:  98% 96% 98%  Weight:       General exam: Appears calm and comfortable  Respiratory system: Clear to auscultation. Respiratory effort normal. Cardiovascular system: S1 & S2 heard, RRR. No JVD, murmurs, rubs, gallops or clicks. No pedal edema. Gastrointestinal system: Abdomen is nondistended, soft and nontender. No organomegaly or masses felt. Normal bowel sounds heard. Central nervous system: Alert and oriented.  Right leg weakness. Extremities: Symmetric 5 x 5 power. Skin: No rashes, lesions or ulcers Psychiatry: Judgement and insight appear normal. Mood & affect appropriate.    Data Reviewed:  Review lab results.  Family Communication: None  Disposition: Status is: Inpatient Remains inpatient appropriate because: Severity of disease, IV treatment.     Time spent: 35 minutes  Author: Marrion Coy, MD 11/10/2022 11:20 AM  For on call review [email or web address].

## 2022-11-10 NOTE — Progress Notes (Signed)
Neurology Progress Note   S:// Continues to complain of lower extremity weakness more on the right side. Unable to ambulate without support-still very unsteady on his feet  O:// Current vital signs: BP (!) 164/108 (BP Location: Left Arm)   Pulse 88   Temp 98.1 F (36.7 C) (Oral)   Resp 18   Wt 74.8 kg   SpO2 98%   BMI 25.09 kg/m  Vital signs in last 24 hours: Temp:  [97.7 F (36.5 C)-98.5 F (36.9 C)] 98.1 F (36.7 C) (04/06 0940) Pulse Rate:  [58-88] 88 (04/06 0940) Resp:  [18-22] 18 (04/06 0940) BP: (123-176)/(87-113) 164/108 (04/06 0940) SpO2:  [96 %-98 %] 98 % (04/06 0940) General: Awake alert in no distress HEENT: Alopecia involving eyelids and eyebrows CVS regular rate rhythm Extremities warm well-perfused Neurological exam Awake alert oriented x 3 Tremulousness in both hands which is bothersome No dysarthria No aphasia Cranial nerves II to XII intact Motor examination with generalized 4+/5 strength distally in bilateral upper and lower extremities, proximally-4/5 with subtle differences in right and left-right side appears slightly more weaker than the left-both lower extremities. Sensation diminished in the lower extremities in a stocking type pattern.  Intact on the upper extremities. Coordination: Dysmetria in both lower remedies. DTRs: Hyperreflexic in bilateral lower extremities.  Hyperreflexic upper extremities as well.  Medications  Current Facility-Administered Medications:    acetaminophen (TYLENOL) tablet 650 mg, 650 mg, Oral, Q6H PRN, 650 mg at 11/09/22 0834 **OR** acetaminophen (TYLENOL) suppository 650 mg, 650 mg, Rectal, Q6H PRN, Cox, Amy N, DO   ascorbic acid (VITAMIN C) tablet 250 mg, 250 mg, Oral, BID, Cox, Amy N, DO, 250 mg at 11/10/22 [id number]   chlordiazePOXIDE (LIBRIUM) capsule 25 mg, 25 mg, Oral, TID, Marrion CoyZhang, Dekui, MD, 25 mg at 11/10/22 [id number]   cholecalciferol (VITAMIN D3) 25 MCG (1000 UNIT) tablet 1,000 Units, 1,000 Units, Oral, Daily, Marrion CoyZhang,  Dekui, MD, 1,000 Units at 11/09/22 [id number]   cyanocobalamin (VITAMIN B12) tablet 1,000 mcg, 1,000 mcg, Oral, Daily, Marrion CoyZhang, Dekui, MD, 1,000 mcg at 11/10/22 0837   enoxaparin (LOVENOX) injection 40 mg, 40 mg, Subcutaneous, Q24H, Cox, Amy N, DO, 40 mg at 11/09/22 2146   folic acid (FOLVITE) tablet 1 mg, 1 mg, Oral, Daily, Cox, Amy N, DO, 1 mg at 11/10/22 0836   LORazepam (ATIVAN) tablet 1-4 mg, 1-4 mg, Oral, Q1H PRN, 2 mg at 11/09/22 0839 **OR** LORazepam (ATIVAN) injection 1-4 mg, 1-4 mg, Intravenous, Q1H PRN, Cox, Amy N, DO, 1 mg at 11/10/22 [id number]   multivitamin with minerals tablet 1 tablet, 1 tablet, Oral, Daily, Cox, Amy N, DO, 1 tablet at 11/10/22 [id number]   nicotine (NICODERM CQ - dosed in mg/24 hours) patch 21 mg, 21 mg, Transdermal, Daily, Milon DikesArora, Manaal Mandala, MD, 21 mg at 11/09/22 1351   ondansetron (ZOFRAN) tablet 4 mg, 4 mg, Oral, Q6H PRN **OR** ondansetron (ZOFRAN) injection 4 mg, 4 mg, Intravenous, Q6H PRN, Cox, Amy N, DO   pantoprazole (PROTONIX) EC tablet 40 mg, 40 mg, Oral, Daily, Chipper HerbZhang, Dekui, MD   senna-docusate (Senokot-S) tablet 1 tablet, 1 tablet, Oral, QHS PRN, Cox, Amy N, DO   thiamine (VITAMIN B1) 500 mg in sodium chloride 0.9 % 50 mL IVPB, 500 mg, Intravenous, Q8H, Stopped at 11/10/22 0721 **FOLLOWED BY** [START ON 11/11/2022] thiamine (VITAMIN B1) 250 mg in sodium chloride 0.9 % 50 mL IVPB, 250 mg, Intravenous, Daily **FOLLOWED BY** [START ON 11/17/2022] thiamine (VITAMIN B1) injection 100 mg, 100 mg, Intravenous, Daily, Milon DikesArora, Markian Glockner, MD Labs CBC  Component Value Date/Time   WBC 6.3 11/09/2022 0408   RBC 4.21 (L) 11/09/2022 0408   HGB 14.1 11/09/2022 0408   HCT 42.4 11/09/2022 0408   PLT 235 11/09/2022 0408   MCV 100.7 (H) 11/09/2022 0408   MCH 33.5 11/09/2022 0408   MCHC 33.3 11/09/2022 0408   RDW 14.5 11/09/2022 0408    CMP     Component Value Date/Time   NA 135 11/10/2022 0429   K 3.6 11/10/2022 0429   CL 99 11/10/2022 0429   CO2 24 11/10/2022 0429   GLUCOSE 102 (H)  11/10/2022 0429   BUN 13 11/10/2022 0429   CREATININE 0.67 11/10/2022 0429   CALCIUM 9.4 11/10/2022 0429   PROT 7.9 11/10/2022 0429   ALBUMIN 4.3 11/10/2022 0429   AST 85 (H) 11/10/2022 0429   ALT 66 (H) 11/10/2022 0429   ALKPHOS 84 11/10/2022 0429   BILITOT 2.3 (H) 11/10/2022 0429   GFRNONAA >60 11/10/2022 0429    Imaging I have reviewed images in epic and the results pertinent to this consultation are: MR brain no acute changes 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  Assessment: 57 year old past medical history of alcohol and tobacco abuse presenting for evaluation of tremulousness gait ataxia whole body hair loss on examination has severe gait ataxia, clubbing and bilateral lower extremity weakness more than upper extremity weakness with somewhat focality on the right side. Broad differential including alcoholic myopathy and neuropathy versus autoimmune or paraneoplastic versus nutritional deficiencies such as vitamin E deficiency, copper or B12 related myelopathy versus toxic myelopathy from toxins such as heavy metals-are being entertained at this time.  Recommendations: C/o back pain and has more LE weakness than UE - will order L-spine MRI Will send voltage gated calcium channel antibodies Continue to replete B12 Check MMA and homocysteine Await pending tests PT OT Management of hyperbilirubinemia per primary team Possibly would benefit from outpatient EMG nerve conduction study at some point as well. Will follow  -- Milon Dikes, MD Neurologist [facility] Pager: [phone]

## 2022-11-11 DIAGNOSIS — F1010 Alcohol abuse, uncomplicated: Secondary | ICD-10-CM | POA: Diagnosis not present

## 2022-11-11 DIAGNOSIS — R531 Weakness: Secondary | ICD-10-CM | POA: Diagnosis not present

## 2022-11-11 LAB — CERULOPLASMIN: Ceruloplasmin: 16.9 mg/dL (ref 16.0–31.0)

## 2022-11-11 MED ORDER — CHLORDIAZEPOXIDE HCL 5 MG PO CAPS
10.0000 mg | ORAL_CAPSULE | Freq: Three times a day (TID) | ORAL | Status: DC
  Administered 2022-11-11 – 2022-11-13 (×6): 10 mg via ORAL
  Filled 2022-11-11 (×6): qty 2

## 2022-11-11 NOTE — Evaluation (Signed)
Physical Therapy Evaluation Patient Details Name: Lee Mueller MRN: [id number] DOB: 1965/10/04 Today's Date: 11/11/2022  History of Present Illness  Pt is a 57 y/o M admitted on 11/08/22 after presenting to the ED for c/c of worsening tremors & complete hair loss over the last month. Pt is being treated for AMS, alcohol withdrawal with delirium, & alcoholic hepatitis. PMH: generalized muscle weakness, tremor  Clinical Impression  Pt seen for PT evaluation with pt agreeable to tx. Pt reports prior to admission he was living alone in an apartment on the 2nd level with flight of stairs with R rail to access.  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 On this date, pt demonstrates BLE/BUE tremors/ataxia with R side slightly worse than left; pt notes tremors have been ongoing for a few weeks. Pt attempts STS but requires BUE support for safety, balance & support during transfer & demonstrates inability to walk without BUE support. Provided pt with RW & pt able to ambulate house hold distances with CGA<>Min assist but fatigues quickly. Pt endorses dizziness throughout session but BP not low. Recommend ongoing PT services to address coordination, balance, strengthening & gait with LRAD.   Recommendations for follow up therapy are one component of a multi-disciplinary discharge planning process, led by the attending physician.  Recommendations may be updated based on patient status, additional functional criteria and insurance authorization.  Follow Up Recommendations Can patient physically be transported by private vehicle: Yes     Assistance Recommended at Discharge Intermittent Supervision/Assistance  Patient can return home with the following  A little help with bathing/dressing/bathroom;A little help with walking and/or transfers;Assistance with  cooking/housework;Assist for transportation;Help with stairs or ramp for entrance    Equipment Recommendations Rolling walker (2 wheels)  Recommendations for Other Services  OT consult    Functional Status Assessment Patient has had a recent decline in their functional status and demonstrates the ability to make significant improvements in function in a reasonable and predictable amount of time.     Precautions / Restrictions Precautions Precautions: Fall Restrictions Weight Bearing Restrictions: No      Mobility  Bed Mobility Overal bed mobility: Needs Assistance Bed Mobility: Supine to Sit     Supine to sit: Supervision, HOB elevated     General bed mobility comments: use of bed rails    Transfers Overall transfer level: Needs assistance Equipment used: None Transfers: Sit to/from Stand Sit to Stand: Min assist, Min guard           General transfer comment: STS with pt reaching for counter for BUE support 2/2 impaired balance    Ambulation/Gait Ambulation/Gait assistance: Min assist, Min guard Gait Distance (Feet): 100 Feet Assistive device: Rolling walker (2 wheels) Gait Pattern/deviations: Trunk flexed, Decreased step length - right, Decreased step length - left, Decreased stride length Gait velocity: decreased     General Gait Details: PT provides cuing for upright posture & need to ambulate within base of AD but poor return demo. Pt fatigues quickly with activity, reports it "takes a lot out of" him.  Stairs            Wheelchair Mobility    Modified Rankin (Stroke Patients Only)       Balance Overall balance assessment: Needs assistance Sitting-balance support: Feet supported Sitting balance-Leahy Scale: Fair  Standing balance support: During functional activity, Bilateral upper extremity supported Standing balance-Leahy Scale: Poor                               Pertinent Vitals/Pain Pain Assessment Pain Assessment:  No/denies pain    Home Living Family/patient expects to be discharged to:: Private residence Living Arrangements: Alone   Type of Home: Apartment Home Access: Stairs to enter Entrance Stairs-Rails: Right Entrance Stairs-Number of Steps: flight   Home Layout: One level Home Equipment: None      Prior Function Prior Level of Function : Working/employed             Mobility Comments: 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 Doesn't drive, friend takes him to work. Notes 1 fall about a month ago, tripping over coffee table.       Hand Dominance        Extremity/Trunk Assessment   Upper Extremity Assessment Upper Extremity Assessment: Generalized weakness (clubbing of tips of fingers on BUE, BUE tremors & impaired coordination (finger to nose) but R slightly worse than L)    Lower Extremity Assessment Lower Extremity Assessment: Generalized weakness (impaired coordination BLE with heel to shin with RLE slightly worse than L)    Cervical / Trunk Assessment Cervical / Trunk Assessment: Kyphotic (rounded shoulders, forward head, stands & walks with trunk flexed with inability to upright with cuing.)  Communication   Communication: No difficulties  Cognition Arousal/Alertness: Awake/alert Behavior During Therapy: WFL for tasks assessed/performed Overall Cognitive Status: Within Functional Limits for tasks assessed                                 General Comments: Not formally assessed, need to assess further. Pt unable to describe exactly what happened to cause RLE or back injury at work, and answers questions with vague responses (decreased memory).        General Comments General comments (skin integrity, edema, etc.): Pt c/o dizziness intermittently throughout session, BP sitting in recliner 129/96 mmHg MAP 106, HR 101 bpm    Exercises      Assessment/Plan    PT Assessment Patient needs continued PT services  PT Problem List Decreased strength;Decreased coordination;Decreased activity tolerance;Cardiopulmonary status limiting activity;Decreased balance;Decreased mobility;Decreased safety awareness;Decreased knowledge of precautions;Decreased knowledge of use of DME;Decreased range of motion;Decreased cognition       PT Treatment Interventions DME instruction;Therapeutic exercise;Gait training;Balance training;Neuromuscular re-education;Functional mobility training;Therapeutic activities;Patient/family education;Cognitive remediation;Stair training;Modalities    PT Goals (Current goals can be found in the Care Plan section)  Acute Rehab PT Goals Patient Stated Goal: get better PT Goal Formulation: With patient Time For Goal Achievement: 11/25/22 Potential to Achieve Goals: Good    Frequency Min 3X/week     Co-evaluation               AM-PAC PT "6 Clicks" Mobility  Outcome Measure Help needed turning from your back to your side while in a flat bed without using bedrails?: None Help needed moving from lying on your back to sitting on the side of a flat bed without using bedrails?: A Little Help needed moving to and from a bed to a chair (including a wheelchair)?: A Little Help needed standing up from a chair using  your arms (e.g., wheelchair or bedside chair)?: A Little Help needed to walk in hospital room?: A Little Help needed climbing 3-5 steps with a railing? : A Little 6 Click Score: 19    End of Session   Activity Tolerance: Patient tolerated treatment well;Patient limited by fatigue Patient left: in chair;with chair alarm set;with call bell/phone within reach Nurse Communication: Mobility status PT Visit Diagnosis: Unsteadiness on feet (R26.81);Other symptoms and signs involving the nervous system (R29.898);Other abnormalities of gait and mobility (R26.89);Difficulty in walking, not elsewhere  classified (R26.2);Muscle weakness (generalized) (M62.81)    Time: [id number] PT Time Calculation (min) (ACUTE ONLY): 18 min   Charges:   PT Evaluation $PT Eval Moderate Complexity: 1 Mod          Aleda GranaVictoria Virat Prather, PT, DPT 11/11/22, 10:18 AM   Sandi MariscalVictoria M Likisha Alles 11/11/2022, 10:16 AM

## 2022-11-11 NOTE — Plan of Care (Addendum)
Brief progress note Seen this morning Reports of insomnia and nausea No other acute changes No change in exam Labs pending: - Ceruloplasmin -heavy metals - MMA - Homocystine - Autoimmune and paraneoplastic panel - Thiamine levels - Vitamin E levels  Will need follow-up on these labs-likely outpatient.  Recommend outpatient neurology follow-up in 2 to 4 weeks. Refer to [facility] (Drs. Sherryll Burger or Malvin Johns). PT OT No further inpatient workup at this time Will hold off on LP until all the above workup is reviewed and followed up. Not hypo or areflexic.  D/W Dr. Chipper Herb  -- Milon Dikes, MD Neurologist [facility] Pager: [phone]

## 2022-11-11 NOTE — Progress Notes (Signed)
  Progress Note   Patient: Lee Mueller LMR:[id number] DOB: October 02, 1965 DOA: 11/08/2022     3 DOS: the patient was seen and examined on 11/11/2022   Brief hospital course: [name] is a 57 year old male with history of generalized muscle weakness, tremor, who presents emergency department for chief concerns of worsening tremors and complete hair loss over the last month.  Tremor was worse on the right side with some weakness.  MRI of the brain did not show any acute changes.  MRI neck did not show any cervical stenosis. 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 Patient also has been seen by neurology for right sided weakness, given total hair loss, workup started.   Principal Problem:   Altered mental status Active Problems:   Complete loss of hair   Hair loss   Finger clubbing   Alcohol use   Elevated liver enzymes   Tremor of both hands   Alcohol withdrawal delirium   Alcoholic hepatitis   Vitamin D deficiency   Alcohol abuse   Right sided weakness   Assessment and Plan: * Altered mental status Alcohol withdrawal with delirium. Alcoholic hepatitis. Patient currently is in alcohol withdrawal, he has a severe tremor, mental status better today.  Continue CIWA protocol.  Also started scheduled Librium. Patient has elevated liver enzymes including bilirubin, consistent with alcoholic hepatitis.  Right upper quadrant ultrasound showed hepatic steatosis consistent with alcohol hepatitis.  Hepatitis panel negative, HIV negative. Condition is improving, no confusion, taper down Librium.   Complete loss of hair Vitamin D deficiency. Right leg weakness. Heavy metal, lead, copper sent out, results pending. Iron, ferritin level normal.  Give vitamin D for vitamin D deficiency.Free and total testosterone level pending. B12 given for level of 375.  B12 started orally. Discussed with neurology, condition most likely due to alcohol.  Will not plan any additional LP  study.  Patient can be going home tomorrow follow-up with neurology for pending test results. PT and OT ordered.    Finger clubbing CT chest with contrast did not show any lung mass.      Subjective:  Patient doing better today, no confusion.  Still complaining of right sided weakness, tremors better.  Physical Exam: Vitals:   11/10/22 1424 11/10/22 2053 11/11/22 0506 11/11/22 0812  BP: (!) 153/100 (!) 142/83 (!) 151/94 (!) 151/91  Pulse: 83 63 (!) 55 69  Resp:   16 16  Temp:  98 F (36.7 C) 97.9 F (36.6 C) 98.3 F (36.8 C)  TempSrc:  Oral Oral   SpO2:   97% 97%  Weight:      Height:       General exam: Appears calm and comfortable  Respiratory system: Clear to auscultation. Respiratory effort normal. Cardiovascular system: S1 & S2 heard, RRR. No JVD, murmurs, rubs, gallops or clicks. No pedal edema. Gastrointestinal system: Abdomen is nondistended, soft and nontender. No organomegaly or masses felt. Normal bowel sounds heard. Central nervous system: Alert and oriented.  Right leg weakness. Extremities: Symmetric 5 x 5 power. Skin: No rashes, lesions or ulcers Psychiatry: Judgement and insight appear normal. Mood & affect appropriate.    Data Reviewed:  Lab results reviewed.  Family Communication: None  Disposition: Status is: Inpatient Remains inpatient appropriate because: Severity of disease.     Time spent: 35 minutes  Author: Marrion Coy, MD 11/11/2022 11:33 AM  For on call review [email or web address].

## 2022-11-12 DIAGNOSIS — R748 Abnormal levels of other serum enzymes: Secondary | ICD-10-CM

## 2022-11-12 DIAGNOSIS — R531 Weakness: Secondary | ICD-10-CM | POA: Diagnosis not present

## 2022-11-12 LAB — BASIC METABOLIC PANEL
Anion gap: 5 (ref 5–15)
BUN: 13 mg/dL (ref 6–20)
CO2: 27 mmol/L (ref 22–32)
Calcium: 8.9 mg/dL (ref 8.9–10.3)
Chloride: 103 mmol/L (ref 98–111)
Creatinine, Ser: 0.86 mg/dL (ref 0.61–1.24)
GFR, Estimated: >60 mL/min (ref >60)
Glucose, Bld: 118 mg/dL — ABNORMAL HIGH (ref 70–99)
Potassium: 4 mmol/L (ref 3.5–5.1)
Sodium: 135 mmol/L (ref 135–145)

## 2022-11-12 LAB — TESTOSTERONE,FREE AND TOTAL
Testosterone, Free: 7.3 pg/mL (ref 7.2–24.0)
Testosterone: 338 ng/dL (ref 264–916)

## 2022-11-12 LAB — VITAMIN B1: Vitamin B1 (Thiamine): 127.7 nmol/L (ref 66.5–200.0)

## 2022-11-12 LAB — MAGNESIUM: Magnesium: 2 mg/dL (ref 1.7–2.4)

## 2022-11-12 MED ORDER — AMLODIPINE BESYLATE 5 MG PO TABS
5.0000 mg | ORAL_TABLET | Freq: Every day | ORAL | Status: DC
  Administered 2022-11-12 – 2022-11-14 (×3): 5 mg via ORAL
  Filled 2022-11-12 (×3): qty 1

## 2022-11-12 MED ORDER — ENSURE ENLIVE PO LIQD
237.0000 mL | Freq: Two times a day (BID) | ORAL | Status: DC
  Administered 2022-11-12 – 2022-11-14 (×3): 237 mL via ORAL

## 2022-11-12 NOTE — Progress Notes (Signed)
Physical Therapy Treatment Patient Details Name: Lee Mueller MRN: [id number] DOB: [birth year] Today's Date: 11/12/2022   History of Present Illness Pt is a 57 y/o M admitted on 11/08/22 after presenting to the ED for c/c of worsening tremors & complete hair loss over the last month. Pt is being treated for AMS, alcohol withdrawal with delirium, & alcoholic hepatitis. PMH: generalized muscle weakness, tremor    PT Comments    Patient is agreeable to PT. He reports continued tremors and difficulty with mobility compared to his baseline. Without rolling walker patient is unsteady with gait with narrow base of support and required external support for ambulation and dynamic balance. Independence with functional standing activity improved with use of rolling walker. Recommend to continue PT to maximize independence and decrease caregiver burden.    Recommendations for follow up therapy are one component of a multi-disciplinary discharge planning process, led by the attending physician.  Recommendations may be updated based on patient status, additional functional criteria and insurance authorization.  Follow Up Recommendations  Can patient physically be transported by private vehicle: Yes    Assistance Recommended at Discharge Intermittent Supervision/Assistance  Patient can return home with the following A little help with bathing/dressing/bathroom;A little help with walking and/or transfers;Assistance with cooking/housework;Assist for transportation;Help with stairs or ramp for entrance   Equipment Recommendations  Rolling walker (2 wheels)    Recommendations for Other Services       Precautions / Restrictions Precautions Precautions: Fall Restrictions Weight Bearing Restrictions: No     Mobility  Bed Mobility Overal bed mobility: Needs Assistance Bed Mobility: Supine to Sit, Sit to Supine     Supine to sit: Supervision, HOB elevated Sit to supine: Supervision   General bed  mobility comments: increased time required    Transfers Overall transfer level: Needs assistance Equipment used: None Transfers: Sit to/from Stand Sit to Stand: Min assist           General transfer comment: steadying assistance provided    Ambulation/Gait Ambulation/Gait assistance: Min assist, Min guard Gait Distance (Feet): 100 Feet Assistive device: Rolling walker (2 wheels), None Gait Pattern/deviations: Step-to pattern, Trunk flexed, Narrow base of support Gait velocity: decreased     General Gait Details: patient ambulated 32ft without device with very narrow base of support and increased unsteadiness requiring Min A for support. with rolling walker, gait pattern improves and patient required only Min guard for safety. activity tolerance limited by fatigue   Stairs             Wheelchair Mobility    Modified Rankin (Stroke Patients Only)       Balance Overall balance assessment: Needs assistance Sitting-balance support: Feet supported Sitting balance-Leahy Scale: Fair     Standing balance support: Bilateral upper extremity supported, No upper extremity supported Standing balance-Leahy Scale: Poor Standing balance comment: extrenal support required from therapist when not using rolling walker                            Cognition Arousal/Alertness: Awake/alert Behavior During Therapy: WFL for tasks assessed/performed Overall Cognitive Status: Within Functional Limits for tasks assessed                                          Exercises      General Comments General comments (skin integrity, edema, etc.): No c/o dizziness  Pertinent Vitals/Pain Pain Assessment Pain Assessment: No/denies pain    Home Living Family/patient expects to be discharged to:: Private residence Living Arrangements: Alone   Type of Home: Apartment Home Access: Stairs to enter Entrance Stairs-Rails: Right Entrance Stairs-Number of  Steps: flight   Home Layout: One level Home Equipment: None      Prior Function            PT Goals (current goals can now be found in the care plan section) Acute Rehab PT Goals Patient Stated Goal: get better PT Goal Formulation: With patient Time For Goal Achievement: 11/25/22 Potential to Achieve Goals: Good Progress towards PT goals: Progressing toward goals    Frequency    Min 3X/week      PT Plan Current plan remains appropriate    Co-evaluation              AM-PAC PT "6 Clicks" Mobility   Outcome Measure  Help needed turning from your back to your side while in a flat bed without using bedrails?: None Help needed moving from lying on your back to sitting on the side of a flat bed without using bedrails?: A Little Help needed moving to and from a bed to a chair (including a wheelchair)?: A Little Help needed standing up from a chair using your arms (e.g., wheelchair or bedside chair)?: A Little Help needed to walk in hospital room?: A Little Help needed climbing 3-5 steps with a railing? : A Little 6 Click Score: 19    End of Session   Activity Tolerance: Patient tolerated treatment well Patient left: in bed;with call bell/phone within reach   PT Visit Diagnosis: Unsteadiness on feet (R26.81);Other symptoms and signs involving the nervous system (R29.898);Other abnormalities of gait and mobility (R26.89);Difficulty in walking, not elsewhere classified (R26.2);Muscle weakness (generalized) (M62.81)     Time: 2683-4196 PT Time Calculation (min) (ACUTE ONLY): 16 min  Charges:  $Therapeutic Activity: 8-22 mins                     Donna Bernard, PT, MPT    Ina Homes 11/12/2022, 3:20 PM

## 2022-11-12 NOTE — Progress Notes (Signed)
Initial Nutrition Assessment  DOCUMENTATION CODES:   Severe malnutrition in context of acute illness/injury  INTERVENTION:  - Add Ensure Enlive po BID, each supplement provides 350 kcal and 20 grams of protein.  NUTRITION DIAGNOSIS:   Severe Malnutrition related to acute illness as evidenced by severe fat depletion, severe muscle depletion, energy intake < or equal to 50% for > or equal to 5 days.  GOAL:   Patient will meet greater than or equal to 90% of their needs  MONITOR:   PO intake, Supplement acceptance  REASON FOR ASSESSMENT:   Malnutrition Screening Tool    ASSESSMENT:   57 y.o. male admits related to right-sided weakness, insomnia, and complete hair loss. PMH includes: alcohol use. Pt is currently receiving medical management related to AMS in setting of alcohol withdrawal.  Meds reviewed: Vit C, Vit D3, Vit B12, folic acid, MVI, thiamine. Labs reviewed: WDL.   Pt reports that he has been eating better since admission. He states that he has had a very poor appetite for the past 2 weeks. Wts are stable per record. Pt states that he would like to try Ensure BID. RD will add supplements and continue to monitor PO intakes.   NUTRITION - FOCUSED PHYSICAL EXAM:  Flowsheet Row Most Recent Value  Orbital Region Severe depletion  Upper Arm Region Moderate depletion  Thoracic and Lumbar Region Unable to assess  Buccal Region Moderate depletion  Temple Region Severe depletion  Clavicle Bone Region Moderate depletion  Clavicle and Acromion Bone Region Moderate depletion  Scapular Bone Region Unable to assess  Dorsal Hand Moderate depletion  Patellar Region Moderate depletion  Anterior Thigh Region Moderate depletion  Posterior Calf Region Moderate depletion  Edema (RD Assessment) None  Hair Reviewed  Eyes Reviewed  Mouth Reviewed  Skin Reviewed  Nails Reviewed       Diet Order:   Diet Order             Diet Heart Room service appropriate? Yes; Fluid  consistency: Thin  Diet effective now                   EDUCATION NEEDS:   Not appropriate for education at this time  Skin:  Skin Assessment: Reviewed RN Assessment  Last BM:  4/6  Height:   Ht Readings from Last 1 Encounters:  11/08/22 5\' 8"  (1.727 m)    Weight:   Wt Readings from Last 1 Encounters:  11/08/22 74.8 kg    Ideal Body Weight:     BMI:  Body mass index is 25.09 kg/m.  Estimated Nutritional Needs:   Kcal:  1115-5208 kcals  Protein:  90-115 gm  Fluid:  >/= 1.8 L  Bethann Humble, RD, LDN, CNSC.

## 2022-11-12 NOTE — Progress Notes (Signed)
Progress Note   Patient: Lee Mueller BBU:[id number] DOB: February 18, 1966 DOA: 11/08/2022     4 DOS: the patient was seen and examined on 11/12/2022   Brief hospital course: [name] is a 57 year old male with history of generalized muscle weakness, tremor, who presents emergency department for chief concerns of worsening tremors and complete hair loss over the last month.  Tremor was worse on the right side with some weakness.  MRI of the brain did not show any acute changes.  MRI neck did not show any cervical stenosis. 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 Patient also has been seen by neurology for right sided weakness, given total hair loss, workup started. 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  Can discharge and follow-up with neurology as outpatient.  However, due to severe weakness, PT recommended nursing home placement.   Principal Problem:   Altered mental status Active Problems:   Complete loss of hair   Hair loss   Finger clubbing   Alcohol use   Elevated liver enzymes   Tremor of both hands   Alcohol withdrawal delirium   Alcoholic hepatitis   Vitamin D deficiency   Alcohol abuse   Right sided weakness   Assessment and Plan:  Altered mental status Alcohol withdrawal with delirium. Alcoholic hepatitis. Patient currently is in alcohol withdrawal, he has a severe tremor, mental status better today.  Continue CIWA protocol.  Also started scheduled Librium. Patient has elevated liver enzymes including bilirubin, consistent with alcoholic hepatitis.  Right upper quadrant ultrasound showed hepatic steatosis consistent with alcohol hepatitis.  Hepatitis panel negative, HIV negative. Condition improved, continue CIWA protocol, continue Librium for another day, will discontinue tomorrow.   Complete loss of hair Vitamin D deficiency. Right leg weakness. Heavy metal, lead, copper sent out, results pending. Iron, ferritin level normal.   Give vitamin D for vitamin D deficiency.Free and total testosterone level pending. B12 given for level of 375.  B12 started orally. Discussed with neurology, condition most likely due to alcohol.  Will not plan any additional LP study.  Patient can be going home tomorrow follow-up with neurology for pending test results. PT recommended nursing home placement due to severe weakness.  Currently pending placement.     Finger clubbing CT chest with contrast did not show any lung mass.        Subjective:  Complaining of right leg weakness and tremor.  No confusion or agitation.  Physical Exam: Vitals:   11/11/22 1646 11/11/22 1950 11/12/22 0455 11/12/22 0700  BP: (!) 143/95 110/81 (!) 143/90 (!) 170/97  Pulse: 72 69 60 69  Resp: (!) 24 18 18 16   Temp: 98.2 F (36.8 C) 97.8 F (36.6 C) 97.8 F (36.6 C) 97.7 F (36.5 C)  TempSrc: Oral  Oral Oral  SpO2: 97% 97% 99% 99%  Weight:      Height:       General exam: Appears calm and comfortable  Respiratory system: Clear to auscultation. Respiratory effort normal. Cardiovascular system: S1 & S2 heard, RRR. No JVD, murmurs, rubs, gallops or clicks. No pedal edema. Gastrointestinal system: Abdomen is nondistended, soft and nontender. No organomegaly or masses felt. Normal bowel sounds heard. Central nervous system: Alert and oriented.  Right leg weakness. Extremities: Symmetric 5 x 5 power. Skin: No rashes, lesions or ulcers Psychiatry: Judgement and insight appear normal. Mood & affect appropriate.    Data Reviewed:  Lab results reviewed.  Family Communication: None  Disposition: Status is: Inpatient Remains  inpatient appropriate because: Unsafe discharge, pending nursing home placement.     Time spent: 35 minutes  Author: Marrion Coy, MD 11/12/2022 10:50 AM  For on call review [email or web address].

## 2022-11-12 NOTE — TOC Progression Note (Signed)
Transition of Care [facility]) - Progression Note    Patient Details  Name: Nicolo Szablewski MRN: [id number] Date of Birth: Dec 04, 1965  [facility] of [facility]) CM/SW Contact  Allena Katz, LCSW Phone Number: 11/12/2022, 12:47 PM  Clinical Narrative:   CSW spoke with patient regarding SNF recommendation. Pt reports he is agreeable but does want to run this by his sister when she comes by. Pt is agreeable to referral being sent out in the surrounding area but states that he does want to talk with his sister about it. Pt has never been to rehab before and wanted to make sure this was the right option for him.          Expected Discharge Plan and Services                                               Social Determinants of Health (SDOH) Interventions SDOH Screenings   Food Insecurity: No Food Insecurity (11/10/2022)  Housing: Low Risk  (11/10/2022)  Transportation Needs: Unmet Transportation Needs (11/10/2022)  Utilities: Not At Risk (11/10/2022)  Tobacco Use: Medium Risk (11/08/2022)    Readmission Risk Interventions     No data to display

## 2022-11-12 NOTE — Evaluation (Signed)
Occupational Therapy Evaluation Patient Details Name: Lee Mueller MRN: [id number] DOB: 19-Apr-1966 Today's Date: 11/12/2022   History of Present Illness Pt is a 57 y/o M admitted on 11/08/22 after presenting to the ED for c/c of worsening tremors & complete hair loss over the last month. Pt is being treated for AMS, alcohol withdrawal with delirium, & alcoholic hepatitis. PMH: generalized muscle weakness, tremor   Clinical Impression   Patient agreeable to OT evaluation. Pt presenting with decreased independence in self care, balance, functional mobility/transfers, and endurance. PTA pt lived alone, was independent for ADLs/IADLs, and was working at a [employer]. Friend provides transportation. Pt currently functioning at supervision for bed mobility, CGA-Min A for simulated toilet transfer, and CGA-Min A for functional mobility at room level using a RW. He required Mod A for LB dressing 2/2 RLE and LBP. No c/o dizziness during evaluation. Pt will benefit from skilled acute OT services to address deficits noted below. OT recommends ongoing therapy upon discharge to maximize safety and independence with ADLs, decrease fall risk, and promote return to PLOF.     Recommendations for follow up therapy are one component of a multi-disciplinary discharge planning process, led by the attending physician.  Recommendations may be updated based on patient status, additional functional criteria and insurance authorization.   Assistance Recommended at Discharge Intermittent Supervision/Assistance  Patient can return home with the following A little help with walking and/or transfers;A little help with bathing/dressing/bathroom;Assistance with cooking/housework;Assist for transportation;Help with stairs or ramp for entrance    Functional Status Assessment  Patient has had a recent decline in their functional status and demonstrates the ability to make significant improvements in function in a  reasonable and predictable amount of time.  Equipment Recommendations  Other (comment) (defer to next venue of care)    Recommendations for Other Services       Precautions / Restrictions Precautions Precautions: Fall Restrictions Weight Bearing Restrictions: No      Mobility Bed Mobility Overal bed mobility: Needs Assistance Bed Mobility: Supine to Sit, Sit to Supine     Supine to sit: Supervision, HOB elevated Sit to supine: Supervision        Transfers Overall transfer level: Needs assistance Equipment used: None Transfers: Sit to/from Stand Sit to Stand: Min assist, Min guard                  Balance Overall balance assessment: Needs assistance Sitting-balance support: Feet supported Sitting balance-Leahy Scale: Fair     Standing balance support: During functional activity, Bilateral upper extremity supported Standing balance-Leahy Scale: Poor         ADL either performed or assessed with clinical judgement   ADL Overall ADL's : Needs assistance/impaired     Grooming: Min guard;Standing Grooming Details (indicate cue type and reason): anticipate             Lower Body Dressing: Moderate assistance;Sitting/lateral leans Lower Body Dressing Details (indicate cue type and reason): socks, unable to reach R foot 2/2 pain  Toilet Transfer: Min guard;Minimal assistance;Rolling walker (2 wheels) Toilet Transfer Details (indicate cue type and reason): simulated         Functional mobility during ADLs: Min guard;Minimal assistance;Rolling walker (2 wheels) (~89ft at room level)       Vision Baseline Vision/History: 1 Wears glasses (readers only) Patient Visual Report: No change from baseline       Perception     Praxis      Pertinent Vitals/Pain Pain Assessment  Pain Assessment: 0-10 Pain Score: 7  Pain Location: RLE, LBP Pain Descriptors / Indicators: Sore, Aching Pain Intervention(s): Limited activity within patient's tolerance,  Monitored during session, Repositioned     Hand Dominance     Extremity/Trunk Assessment Upper Extremity Assessment Upper Extremity Assessment: Generalized weakness (BUE tremors & impaired coordination (FTN) - R slightly worse than L)   Lower Extremity Assessment Lower Extremity Assessment: Generalized weakness       Communication Communication Communication: No difficulties   Cognition Arousal/Alertness: Awake/alert Behavior During Therapy: WFL for tasks assessed/performed Overall Cognitive Status: Within Functional Limits for tasks assessed             General Comments  No c/o dizziness    Exercises Other Exercises Other Exercises: OT provided education re: role of OT, OT POC, post acute recs, sitting up for all meals, EOB/OOB mobility with assistance, home/fall safety.     Shoulder Instructions      Home Living Family/patient expects to be discharged to:: Private residence Living Arrangements: Alone   Type of Home: Apartment Home Access: Stairs to enter Enter[employer] CorporationEntrance Stairs-Number of Steps: flight Entrance Stairs-Rails: Right Home Layout: One level     Bathroom Shower/Tub: Chief Strategy OfficerTub/shower unit   Bathroom Toilet: Standard     Home Equipment: None          Prior Functioning/Environment Prior Level of Function : Working/employed;Independent/Modified Independent             Mobility Comments: Pt was working Chief of Staffstocking shelves at [employer] until ~3 weeks or so ago when he injured his RLE then back & has been out on medical leave since. Doesn't drive, friend takes him to work. Notes 1 fall about a month ago, tripping over coffee table. ADLs Comments: Independent ADLs/IADLs, friend drives        OT Problem List: Decreased strength;Decreased coordination;Decreased activity tolerance;Impaired balance (sitting and/or standing);Pain;Decreased knowledge of use of DME or AE;Decreased knowledge of precautions;Decreased range of motion; Decreased safety awareness        OT Treatment/Interventions: Self-care/ADL training;Therapeutic exercise;Neuromuscular education;Energy conservation;DME and/or AE instruction;Manual therapy;Modalities;Balance training;Patient/family education;Visual/perceptual remediation/compensation;Cognitive remediation/compensation;Therapeutic activities;Splinting    OT Goals(Current goals can be found in the care plan section) Acute Rehab OT Goals Patient Stated Goal: get better, return home OT Goal Formulation: With patient Time For Goal Achievement: 11/26/22 Potential to Achieve Goals: Good   OT Frequency: Min 2X/week    Co-evaluation              AM-PAC OT "6 Clicks" Daily Activity     Outcome Measure Help from another person eating meals?: None Help from another person taking care of personal grooming?: A Little Help from another person toileting, which includes using toliet, bedpan, or urinal?: A Little Help from another person bathing (including washing, rinsing, drying)?: A Little Help from another person to put on and taking off regular upper body clothing?: A Little Help from another person to put on and taking off regular lower body clothing?: A Lot 6 Click Score: 18   End of Session Equipment Utilized During Treatment: Gait belt;Rolling walker (2 wheels) Nurse Communication: Mobility status  Activity Tolerance: Patient tolerated treatment well;Patient limited by pain Patient left: in bed;with call bell/phone within reach;with bed alarm set  OT Visit Diagnosis: Unsteadiness on feet (R26.81);Other symptoms and signs involving the nervous system (R29.898);Muscle weakness (generalized) (M62.81);Pain Pain - Right/Left: Right Pain - part of body: Leg (LBP)  Time: 2395-3202 OT Time Calculation (min): 15 min Charges:  OT General Charges $OT Visit: 1 Visit OT Evaluation $OT Eval Low Complexity: 1 Low  [facility] MS, OTR/L ascom [phone]  11/12/22, 12:56 PM

## 2022-11-12 NOTE — [place] FL2 (Signed)
[place] MEDICAID FL2 LEVEL OF CARE FORM     IDENTIFICATION  Patient Name: Lee Mueller Birthdate: 1966-02-14 Sex: male Admission Date (Current Location): 11/08/2022  [facility] : [facility] and [place] Number:  Chiropodist and Address:  [facility], [address] [place], [place] [address]      Provider Number: [id number]  Attending Physician Name and Address:  Marrion Coy, MD  Relative Name and Phone Number:  Callie Fielding (Sister) [phone]    Current Level of Care: Hospital Recommended Level of Care: Skilled Nursing Facility Prior Approval Number:    Date Approved/Denied:   PASRR Number: [id number]  Discharge Plan: SNF    Current Diagnoses: Patient Active Problem List   Diagnosis Date Noted   Right sided weakness 11/10/2022   Alcohol withdrawal delirium 11/09/2022   Alcoholic hepatitis 11/09/2022   Vitamin D deficiency 11/09/2022   Alcohol abuse 11/09/2022   Altered mental status 11/08/2022   Hair loss 11/08/2022   Complete loss of hair 11/08/2022   Finger clubbing 11/08/2022   Alcohol use 11/08/2022   Elevated liver enzymes 11/08/2022   Tremor of both hands 11/08/2022    Orientation RESPIRATION BLADDER Height & Weight     Self, Time, Place, Situation  Normal Continent Weight: 165 lb (74.8 kg) Height:  5\' 8"  (172.7 cm)  BEHAVIORAL SYMPTOMS/MOOD NEUROLOGICAL BOWEL NUTRITION STATUS      Continent Diet  AMBULATORY STATUS COMMUNICATION OF NEEDS Skin   Limited Assist Verbally Normal                       Personal Care Assistance Level of Assistance  Bathing, Feeding, Dressing Bathing Assistance: Limited assistance Feeding assistance: Limited assistance Dressing Assistance: Limited assistance     Functional Limitations Info  Sight, Hearing, Speech Sight Info: Adequate Hearing Info: Adequate Speech Info: Adequate    SPECIAL CARE FACTORS FREQUENCY  PT (By licensed PT), OT (By licensed OT)     PT Frequency: 5 times a  week OT Frequency: 5 times a week            Contractures Contractures Info: Not present    Additional Factors Info  Code Status, Allergies Code Status Info: FULL             Current Medications (11/12/2022):  This is the current hospital active medication list Current Facility-Administered Medications  Medication Dose Route Frequency Provider Last Rate Last Admin   acetaminophen (TYLENOL) tablet 650 mg  650 mg Oral Q6H PRN Cox, Amy N, DO   650 mg at 11/11/22 2208   Or   acetaminophen (TYLENOL) suppository 650 mg  650 mg Rectal Q6H PRN Cox, Amy N, DO       amLODipine (NORVASC) tablet 5 mg  5 mg Oral Daily Marrion Coy, MD   5 mg at 11/12/22 0093   ascorbic acid (VITAMIN C) tablet 250 mg  250 mg Oral BID Cox, Amy N, DO   250 mg at 11/12/22 8182   chlordiazePOXIDE (LIBRIUM) capsule 10 mg  10 mg Oral TID Marrion Coy, MD   10 mg at 11/12/22 0820   cholecalciferol (VITAMIN D3) 25 MCG (1000 UNIT) tablet 1,000 Units  1,000 Units Oral Daily Marrion Coy, MD   1,000 Units at 11/12/22 0820   cyanocobalamin (VITAMIN B12) tablet 1,000 mcg  1,000 mcg Oral Daily Marrion Coy, MD   1,000 mcg at 11/12/22 0820   enoxaparin (LOVENOX) injection 40 mg  40 mg Subcutaneous Q24H Cox, Amy N, DO  40 mg at 11/11/22 2155   folic acid (FOLVITE) tablet 1 mg  1 mg Oral Daily Cox, Amy N, DO   1 mg at 11/12/22 0820   multivitamin with minerals tablet 1 tablet  1 tablet Oral Daily Cox, Amy N, DO   1 tablet at 11/12/22 8366   nicotine (NICODERM CQ - dosed in mg/24 hours) patch 21 mg  21 mg Transdermal Daily Milon Dikes, MD   21 mg at 11/12/22 0820   ondansetron (ZOFRAN) tablet 4 mg  4 mg Oral Q6H PRN Cox, Amy N, DO   4 mg at 11/12/22 0455   Or   ondansetron (ZOFRAN) injection 4 mg  4 mg Intravenous Q6H PRN Cox, Amy N, DO   4 mg at 11/10/22 2106   pantoprazole (PROTONIX) EC tablet 40 mg  40 mg Oral Daily Marrion Coy, MD   40 mg at 11/12/22 0820   senna-docusate (Senokot-S) tablet 1 tablet  1 tablet Oral QHS  PRN Cox, Amy N, DO       thiamine (VITAMIN B1) 250 mg in sodium chloride 0.9 % 50 mL IVPB  250 mg Intravenous Daily Milon Dikes, MD 100 mL/hr at 11/12/22 0845 250 mg at 11/12/22 0845   Followed by   Melene Muller ON 11/17/2022] thiamine (VITAMIN B1) injection 100 mg  100 mg Intravenous Daily Milon Dikes, MD         Discharge Medications: Please see discharge summary for a list of discharge medications.  Relevant Imaging Results:  Relevant Lab Results:   Additional Information SS# [id number]  Allena Katz, LCSW

## 2022-11-13 DIAGNOSIS — E43 Unspecified severe protein-calorie malnutrition: Secondary | ICD-10-CM | POA: Insufficient documentation

## 2022-11-13 DIAGNOSIS — R531 Weakness: Secondary | ICD-10-CM | POA: Diagnosis not present

## 2022-11-13 DIAGNOSIS — K7010 Alcoholic hepatitis without ascites: Secondary | ICD-10-CM | POA: Diagnosis not present

## 2022-11-13 LAB — COPPER, SERUM: Copper: 68 ug/dL — ABNORMAL LOW (ref 69–132)

## 2022-11-13 LAB — VITAMIN E
Vitamin E (Alpha Tocopherol): 6 mg/L — ABNORMAL LOW (ref 7.0–25.1)
Vitamin E (Alpha Tocopherol): 8.4 mg/L (ref 7.0–25.1)
Vitamin E(Gamma Tocopherol): 0.8 mg/L (ref 0.5–5.5)
Vitamin E(Gamma Tocopherol): 1 mg/L (ref 0.5–5.5)

## 2022-11-13 LAB — HEAVY METALS, BLOOD
Arsenic: 1 ug/L (ref 0–9)
Lead: <1 ug/dL (ref 0.0–3.4)
Mercury: <1 ug/L (ref 0.0–14.9)

## 2022-11-13 LAB — MISC LABCORP TEST (SEND OUT): Labcorp test code: 505500

## 2022-11-13 LAB — ZINC: Zinc: 76 ug/dL (ref 44–115)

## 2022-11-13 MED ORDER — SENNOSIDES-DOCUSATE SODIUM 8.6-50 MG PO TABS
2.0000 | ORAL_TABLET | Freq: Two times a day (BID) | ORAL | Status: DC
  Administered 2022-11-13 – 2022-11-14 (×3): 2 via ORAL
  Filled 2022-11-13 (×3): qty 2

## 2022-11-13 NOTE — Progress Notes (Signed)
Progress Note   Patient: Lee Mueller DOB: February 04, 1966 DOA: 11/08/2022     5 DOS: the patient was seen and examined on 11/13/2022   Brief hospital course: [name] is a 57 year old male with history of generalized muscle weakness, tremor, who presents emergency department for chief concerns of worsening tremors and complete hair loss over the last month.  Tremor was worse on the right side with some weakness.  MRI of the brain did not show any acute changes.  MRI neck did not show any cervical stenosis. 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 Patient also has been seen by neurology for right sided weakness, given total hair loss, workup started. 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  Can discharge and follow-up with neurology as outpatient.  However, due to severe weakness, PT recommended nursing home placement.   Principal Problem:   Altered mental status Active Problems:   Complete loss of hair   Hair loss   Finger clubbing   Alcohol use   Elevated liver enzymes   Tremor of both hands   Alcohol withdrawal delirium   Alcoholic hepatitis   Vitamin D deficiency   Alcohol abuse   Right sided weakness   Protein-calorie malnutrition, severe   Assessment and Plan: Altered mental status Alcohol withdrawal with delirium. Alcoholic hepatitis. Patient currently is in alcohol withdrawal, he has a severe tremor, mental status better today.  Continue CIWA protocol.  Also started scheduled Librium. Patient has elevated liver enzymes including bilirubin, consistent with alcoholic hepatitis.  Right upper quadrant ultrasound showed hepatic steatosis consistent with alcohol hepatitis.  Hepatitis panel negative, HIV negative. Patient condition has improved, CIWA protocol completed, Librium discontinued.  Complete loss of hair Vitamin D deficiency. Right leg weakness. Heavy metal, lead, copper sent out, results pending. Iron, ferritin level  normal.  Give vitamin D for vitamin D deficiency.Free and total testosterone level pending. B12 given for level of 375.  B12 started orally. Discussed with neurology, condition most likely due to alcohol.  Will not plan any additional LP study.  Patient can be going home tomorrow follow-up with neurology for pending test results. PT recommended nursing home placement due to severe weakness.  Currently pending placement.     Finger clubbing CT chest with contrast did not show any lung mass.      Subjective:  Patient still complaining of right leg weakness, no confusion.  Physical Exam: Vitals:   11/12/22 0700 11/12/22 1614 11/12/22 2015 11/13/22 0457  BP: (!) 170/97 138/84 120/78 (!) 153/89  Pulse: 69 69 73 (!) 59  Resp: 16 16 18 18   Temp: 97.7 F (36.5 C) 97.8 F (36.6 C) 98 F (36.7 C) 97.7 F (36.5 C)  TempSrc: Oral  Oral Oral  SpO2: 99% 98% 98% 99%  Weight:      Height:       General exam: Appears calm and comfortable  Respiratory system: Clear to auscultation. Respiratory effort normal. Cardiovascular system: S1 & S2 heard, RRR. No JVD, murmurs, rubs, gallops or clicks. No pedal edema. Gastrointestinal system: Abdomen is nondistended, soft and nontender. No organomegaly or masses felt. Normal bowel sounds heard. Central nervous system: Alert and oriented.  Right leg weakness. Extremities: Symmetric 5 x 5 power. Skin: No rashes, lesions or ulcers Psychiatry: Judgement and insight appear normal. Mood & affect appropriate.    Data Reviewed:  There are no new results to review at this time.  Family Communication: None  Disposition: Status is: Inpatient Remains  inpatient appropriate because: Unsafe discharge.     Time spent: 35 minutes  Author: Marrion Coy, MD 11/13/2022 12:48 PM  For on call review [email or web address].

## 2022-11-14 DIAGNOSIS — R683 Clubbing of fingers: Secondary | ICD-10-CM | POA: Diagnosis not present

## 2022-11-14 DIAGNOSIS — E559 Vitamin D deficiency, unspecified: Secondary | ICD-10-CM | POA: Diagnosis not present

## 2022-11-14 DIAGNOSIS — R251 Tremor, unspecified: Secondary | ICD-10-CM | POA: Diagnosis not present

## 2022-11-14 DIAGNOSIS — F10931 Alcohol use, unspecified with withdrawal delirium: Secondary | ICD-10-CM | POA: Diagnosis not present

## 2022-11-14 DIAGNOSIS — R748 Abnormal levels of other serum enzymes: Secondary | ICD-10-CM | POA: Diagnosis not present

## 2022-11-14 DIAGNOSIS — R531 Weakness: Secondary | ICD-10-CM | POA: Diagnosis not present

## 2022-11-14 DIAGNOSIS — I10 Essential (primary) hypertension: Secondary | ICD-10-CM | POA: Insufficient documentation

## 2022-11-14 DIAGNOSIS — G9341 Metabolic encephalopathy: Secondary | ICD-10-CM | POA: Diagnosis not present

## 2022-11-14 DIAGNOSIS — L631 Alopecia universalis: Secondary | ICD-10-CM | POA: Diagnosis not present

## 2022-11-14 DIAGNOSIS — E43 Unspecified severe protein-calorie malnutrition: Secondary | ICD-10-CM | POA: Diagnosis not present

## 2022-11-14 LAB — HOMOCYSTEINE: Homocysteine: 16.8 umol/L — ABNORMAL HIGH (ref 0.0–14.5)

## 2022-11-14 MED ORDER — ADULT MULTIVITAMIN W/MINERALS CH: 1.0000 | ORAL_TABLET | Freq: Every day | ORAL | Status: DC

## 2022-11-14 MED ORDER — THIAMINE HCL 100 MG PO TABS: 100.0000 mg | ORAL_TABLET | Freq: Every day | ORAL | 1 refills | Status: DC

## 2022-11-14 MED ORDER — ENSURE ENLIVE PO LIQD: 237.0000 mL | Freq: Two times a day (BID) | ORAL | 12 refills | Status: DC

## 2022-11-14 MED ORDER — POLYETHYLENE GLYCOL 3350 17 G PO PACK: 17.0000 g | PACK | Freq: Every day | ORAL | 0 refills | Status: DC | PRN

## 2022-11-14 MED ORDER — AMLODIPINE BESYLATE 5 MG PO TABS: 5.0000 mg | ORAL_TABLET | Freq: Every day | ORAL | 1 refills | Status: DC

## 2022-11-14 MED ORDER — FOLIC ACID 1 MG PO TABS: 1.0000 mg | ORAL_TABLET | Freq: Every day | ORAL | 0 refills | Status: DC

## 2022-11-14 MED ORDER — VITAMIN D 25 MCG (1000 UNIT) PO TABS: 1000.0000 [IU] | ORAL_TABLET | Freq: Every day | ORAL | 0 refills | Status: DC

## 2022-11-14 MED ORDER — ASCORBIC ACID 250 MG PO TABS: 250.0000 mg | ORAL_TABLET | Freq: Every day | ORAL | 0 refills | Status: DC

## 2022-11-14 MED ORDER — CYANOCOBALAMIN 1000 MCG PO TABS: 1000.0000 ug | ORAL_TABLET | Freq: Every day | ORAL | 0 refills | Status: DC

## 2022-11-14 MED ORDER — POLYETHYLENE GLYCOL 3350 17 G PO PACK
17.0000 g | PACK | Freq: Every day | ORAL | Status: DC
  Administered 2022-11-14: 17 g via ORAL
  Filled 2022-11-14: qty 1

## 2022-11-14 NOTE — Discharge Summary (Signed)
Physician Discharge Summary   Patient: Marshell Rotenberg MRN: [id number] DOB: 31-Oct-1965  Admit date:     11/08/2022  Discharge date: 11/14/22  Discharge Physician: Alford Highland   PCP: Pcp, No   Recommendations at discharge:   Will refer to new PCP  Discharge Diagnoses: Principal Problem:   Alcohol withdrawal delirium Active Problems:   Acute metabolic encephalopathy   Complete loss of hair   Finger clubbing   Elevated liver enzymes   Tremor of both hands   Alcoholic hepatitis   Vitamin D deficiency   Alcohol abuse   Right sided weakness   Protein-calorie malnutrition, severe   Essential hypertension    Hospital Course: Mr. Anees Masten is a 57 year old male with history of generalized muscle weakness, tremor, who presents emergency department for chief concerns of worsening tremors and complete hair loss over the last month.  Tremor was worse on the right side with some weakness.  MRI of the brain did not show any acute changes.  MRI neck did not show any cervical stenosis. 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 Patient also has been seen by neurology for right sided weakness, given total hair loss, workup started. 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  Can discharge and follow-up with neurology as outpatient.  However, due to severe weakness, PT recommended nursing home placement.  4/10.  Patient's strength has gradually improved.  Only 1 rehab accepted him as a patient but the insurance does not cover that rehab.  Patient has decided to go home with home health.  Less tremor at this point.  Patient knows he must stop alcohol.  Assessment and Plan: * Alcohol withdrawal delirium Patient went through alcohol withdrawal during hospital course.  Acute metabolic encephalopathy Secondary to alcohol.  Completed withdrawal protocol.  No signs of withdrawal upon discharge.  Complete loss of hair Could be secondary to vitamin  deficiency.  Essential hypertension Prescribed Norvasc.  Protein-calorie malnutrition, severe Continue supplements  Right sided weakness Imaging of the brain and cervical spine and lumbar spine does not account for his weakness.  Doing better with physical therapy.  Likely secondary to alcohol.  Vitamin D deficiency Continue vitamin D supplementation  Tremor of both hands Likely secondary to alcohol abuse  Elevated liver enzymes Secondary to alcohol.  Total bilirubin 2.3, AST 85 and ALT 66  Finger clubbing CT scan of the chest negative         Consultants: Neurology Procedures performed: None Disposition: Home Diet recommendation:  Regular diet DISCHARGE MEDICATION: Allergies as of 11/14/2022   No Known Allergies      Medication List     STOP taking these medications    cyclobenzaprine 5 MG tablet Commonly known as: FLEXERIL   HYDROcodone-acetaminophen 5-325 MG tablet Commonly known as: Norco   ibuprofen 800 MG tablet Commonly known as: ADVIL   nabumetone 750 MG tablet Commonly known as: RELAFEN       TAKE these medications    amLODipine 5 MG tablet Commonly known as: NORVASC Take 1 tablet (5 mg total) by mouth daily. Start taking on: November 15, 2022   ascorbic acid 250 MG tablet Commonly known as: VITAMIN C Take 1 tablet (250 mg total) by mouth daily.   cholecalciferol 25 MCG (1000 UNIT) tablet Commonly known as: VITAMIN D3 Take 1 tablet (1,000 Units total) by mouth daily. Start taking on: November 15, 2022   cyanocobalamin 1000 MCG tablet Take 1 tablet (1,000 mcg total) by mouth daily. Start taking on:  November 15, 2022   feeding supplement Liqd Take 237 mLs by mouth 2 (two) times daily between meals.   folic acid 1 MG tablet Commonly known as: FOLVITE Take 1 tablet (1 mg total) by mouth daily. Start taking on: November 15, 2022   multivitamin with minerals Tabs tablet Take 1 tablet by mouth daily. Start taking on: November 15, 2022    polyethylene glycol 17 g packet Commonly known as: MIRALAX / GLYCOLAX Take 17 g by mouth daily as needed for moderate constipation.   thiamine 100 MG tablet Commonly known as: VITAMIN B1 Take 1 tablet (100 mg total) by mouth daily.               Durable Medical Equipment  (From admission, onward)           Start     Ordered   11/14/22 1058  For home use only DME Walker rolling  Once       Question Answer Comment  Walker: With 5 Inch Wheels   Patient needs a walker to treat with the following condition Generalized weakness      11/14/22 1058            Follow-up Information     Lonell Face, MD. Go on 01/03/2023.   Specialty: Neurology Why: Thursday, 5/30 at 3:15 p.m. Contact information: [address] [facility] [address] [facility] [place] [address] [phone]         Sherron Monday, MD. Go on 11/28/2022.   Specialty: Internal Medicine Why: Gareth Morgan, NP, Wednesday, 4/24 at 9:15 a.m. [phone] Contact information: [address] [place] [address] [phone]                Discharge Exam: Filed Weights   11/08/22 1434  Weight: 74.8 kg   Physical Exam HENT:     Head: Normocephalic.     Mouth/Throat:     Pharynx: No oropharyngeal exudate.  Eyes:     General: Lids are normal.     Conjunctiva/sclera: Conjunctivae normal.  Cardiovascular:     Rate and Rhythm: Normal rate and regular rhythm.     Heart sounds: Normal heart sounds, S1 normal and S2 normal.  Pulmonary:     Breath sounds: No decreased breath sounds, wheezing, rhonchi or rales.  Abdominal:     Palpations: Abdomen is soft.     Tenderness: There is no abdominal tenderness.  Musculoskeletal:     Right lower leg: No swelling.     Left lower leg: No swelling.  Skin:    General: Skin is warm.     Findings: No rash.  Neurological:     Mental Status: He is alert and oriented to person, place, and time.     Comments: Less tremor seen.       Condition at discharge: stable  The results of significant diagnostics from this hospitalization (including imaging, microbiology, ancillary and laboratory) are listed below for reference.   Imaging Studies: MR LUMBAR SPINE WO CONTRAST  Result Date: 11/11/2022 CLINICAL DATA:  Low back pain, cauda equina syndrome suspected EXAM: MRI LUMBAR SPINE WITHOUT CONTRAST TECHNIQUE: Multiplanar, multisequence MR imaging of the lumbar spine was performed. No intravenous contrast was administered. COMPARISON:  None Available. FINDINGS: Segmentation:  5 lumbar type vertebral bodies. Alignment: No listhesis. Straightening of the normal lumbar lordosis. Vertebrae: No acute fracture, suspicious osseous lesion, or evidence of discitis. Conus medullaris and cauda equina: Conus extends to the L1-L2 level. Conus and cauda equina appear normal. Paraspinal  and other soft tissues: Right parapelvic cyst, for which no follow-up is currently indicated. No lymphadenopathy. Disc levels: T12-L1: No significant disc bulge. Mild facet arthropathy. No spinal canal stenosis or neural foraminal narrowing. Disc desiccation and minimal disc bulge. Mild facet arthropathy. No spinal canal stenosis or neural foraminal narrowing. L1-L2: Minimal disc bulge. Mild-to-moderate facet arthropathy. Mild spinal canal stenosis. No neural foraminal narrowing. L2-L3: No significant disc bulge. Mild facet arthropathy. No spinal canal stenosis or neural foraminal narrowing. L3-L4: Mild disc bulge. Mild facet arthropathy. No spinal canal stenosis or neural foraminal narrowing. L4-L5: Minimal disc bulge with right extreme lateral protrusion. Mild facet arthropathy. No spinal canal stenosis. Mild bilateral neural foraminal narrowing. L5-S1: Disc desiccation and minimal disc bulge. No spinal canal stenosis. Mild right neural foraminal narrowing. IMPRESSION: 1. L1-L2 mild spinal canal stenosis. No neural foraminal narrowing. 2. L4-L5 mild bilateral neural  foraminal narrowing. 3. L5-S1 mild right neural foraminal narrowing. 4. Multilevel facet arthropathy, which can be a cause of back pain. Electronically Signed   By: Wiliam Ke M.D.   On: 11/11/2022 02:28   US Abdomen Limited RUQ (LIVER/GB)  Result Date: 11/09/2022 CLINICAL DATA:  Jaundice. EXAM: ULTRASOUND ABDOMEN LIMITED RIGHT UPPER QUADRANT COMPARISON:  CT abdomen and pelvis 11/08/2022 FINDINGS: Gallbladder: No gallstones or wall thickening visualized. No sonographic Murphy sign noted by sonographer. Common bile duct: Diameter: 6 mm, within normal limits. No intrahepatic or extrahepatic biliary ductal dilatation. Liver: No focal lesion identified. Increased hepatic echogenicity and attenuation consistent with the fatty infiltration seen on yesterday's CT. Portal vein is patent on color Doppler imaging with normal direction of blood flow towards the liver. Other: None. IMPRESSION: 1. Normal appearance of the gallbladder. No intrahepatic or extrahepatic biliary ductal dilatation. 2. Hepatic steatosis. Electronically Signed   By: Neita Garnet M.D.   On: 11/09/2022 08:30   MR BRAIN W WO CONTRAST  Result Date: 11/08/2022 CLINICAL DATA:  Myelopathy EXAM: MRI HEAD WITHOUT AND WITH CONTRAST MRI CERVICAL SPINE WITHOUT AND WITH CONTRAST CONTRAST:  7mL GADAVIST GADOBUTROL 1 MMOL/ML IV SOLN TECHNIQUE: Multiplanar, multiecho pulse sequences of the brain and surrounding structures, and cervical were obtained without and with intravenous contrast. COMPARISON:  None Available. FINDINGS: MRI HEAD FINDINGS Brain: Negative for an acute infarct. No hemorrhage. No hydrocephalus. No extra-axial fluid collection. Sequela of mild chronic microvascular ischemic change. Vascular: Normal flow voids. Asymmetrically decreased flow void in the left transverse and sigmoid sinuses likely due physiologic changes. Skull and upper cervical spine: Normal marrow signal. Sinuses/Orbits: No middle ear or mastoid effusion. Mucosal thickening  right maxillary sinus. Orbits are unremarkable. Other: None. MRI CERVICAL SPINE FINDINGS Alignment: Straightening of the normal cervical lordosis. Vertebrae: No fracture, evidence of discitis, or bone lesion. Cord: Normal signal and morphology. Posterior Fossa, vertebral arteries, paraspinal tissues: Negative. Disc levels: C1-C2: Mild degenerative change. C2-C3: Moderate left and mild right facet degenerative change. Uncovertebral hypertrophy. Mild-to-moderate left neural foraminal narrowing. No spinal canal narrowing. C3-C4: Moderate bilateral facet degenerative change. Uncovertebral hypertrophy. Moderate bilateral neural foraminal narrowing. Mild spinal canal narrowing. C4-C5: Moderate bilateral facet degenerative change. Uncovertebral hypertrophy. Minimal disc bulge. Mild spinal canal narrowing. Mild bilateral neural foraminal narrowing. C5-C6: Moderate bilateral facet degenerative change. Uncovertebral hypertrophy. Moderate right and mild left neural foraminal narrowing. Mild spinal canal narrowing. C6-C7: Mild bilateral facet degenerative change. Uncovertebral hypertrophy. No spinal canal narrowing. No significant neural foraminal narrowing. C7-T1: Unremarkable. IMPRESSION: 1. No acute intracranial process. No intracranial contrast enhancing lesion or abnormal contrast enhanacement in the cervical spine.  2. Multilevel cervical spine degenerative changes with up to moderate bilateral neural foraminal narrowing at C3-C4 and on the right at C5-C6. 3. Mild spinal canal stenosis at C3-C4, C4-C5, and C5-C6. Electronically Signed   By: Lorenza Cambridge M.D.   On: 11/08/2022 21:30   MR Cervical Spine W or Wo Contrast  Result Date: 11/08/2022 CLINICAL DATA:  Myelopathy EXAM: MRI HEAD WITHOUT AND WITH CONTRAST MRI CERVICAL SPINE WITHOUT AND WITH CONTRAST CONTRAST:  7mL GADAVIST GADOBUTROL 1 MMOL/ML IV SOLN TECHNIQUE: Multiplanar, multiecho pulse sequences of the brain and surrounding structures, and cervical were  obtained without and with intravenous contrast. COMPARISON:  None Available. FINDINGS: MRI HEAD FINDINGS Brain: Negative for an acute infarct. No hemorrhage. No hydrocephalus. No extra-axial fluid collection. Sequela of mild chronic microvascular ischemic change. Vascular: Normal flow voids. Asymmetrically decreased flow void in the left transverse and sigmoid sinuses likely due physiologic changes. Skull and upper cervical spine: Normal marrow signal. Sinuses/Orbits: No middle ear or mastoid effusion. Mucosal thickening right maxillary sinus. Orbits are unremarkable. Other: None. MRI CERVICAL SPINE FINDINGS Alignment: Straightening of the normal cervical lordosis. Vertebrae: No fracture, evidence of discitis, or bone lesion. Cord: Normal signal and morphology. Posterior Fossa, vertebral arteries, paraspinal tissues: Negative. Disc levels: C1-C2: Mild degenerative change. C2-C3: Moderate left and mild right facet degenerative change. Uncovertebral hypertrophy. Mild-to-moderate left neural foraminal narrowing. No spinal canal narrowing. C3-C4: Moderate bilateral facet degenerative change. Uncovertebral hypertrophy. Moderate bilateral neural foraminal narrowing. Mild spinal canal narrowing. C4-C5: Moderate bilateral facet degenerative change. Uncovertebral hypertrophy. Minimal disc bulge. Mild spinal canal narrowing. Mild bilateral neural foraminal narrowing. C5-C6: Moderate bilateral facet degenerative change. Uncovertebral hypertrophy. Moderate right and mild left neural foraminal narrowing. Mild spinal canal narrowing. C6-C7: Mild bilateral facet degenerative change. Uncovertebral hypertrophy. No spinal canal narrowing. No significant neural foraminal narrowing. C7-T1: Unremarkable. IMPRESSION: 1. No acute intracranial process. No intracranial contrast enhancing lesion or abnormal contrast enhanacement in the cervical spine. 2. Multilevel cervical spine degenerative changes with up to moderate bilateral neural  foraminal narrowing at C3-C4 and on the right at C5-C6. 3. Mild spinal canal stenosis at C3-C4, C4-C5, and C5-C6. Electronically Signed   By: Lorenza Cambridge M.D.   On: 11/08/2022 21:30   CT Angio Chest PE W and/or Wo Contrast  Result Date: 11/08/2022 CLINICAL DATA:  Evaluation for new onset tremors, fatigue, muscle weakness, and difficulty focusing for 4-6 weeks. PE suspected. Acute nonlocalized abdominal pain. EXAM: CT ANGIOGRAPHY CHEST CT ABDOMEN AND PELVIS WITH CONTRAST TECHNIQUE: Multidetector CT imaging of the chest was performed using the standard protocol during bolus administration of intravenous contrast. Multiplanar CT image reconstructions and MIPs were obtained to evaluate the vascular anatomy. Multidetector CT imaging of the abdomen and pelvis was performed using the standard protocol during bolus administration of intravenous contrast. RADIATION DOSE REDUCTION: This exam was performed according to the departmental dose-optimization program which includes automated exposure control, adjustment of the mA and/or kV according to patient size and/or use of iterative reconstruction technique. CONTRAST:  75mL OMNIPAQUE IOHEXOL 350 MG/ML SOLN COMPARISON:  Chest radiographs 11/08/2022 FINDINGS: CTA CHEST FINDINGS Cardiovascular: Satisfactory opacification of the pulmonary arteries to the segmental level. No evidence of pulmonary embolism. Normal heart size. No pericardial effusion. Mediastinum/Nodes: No enlarged mediastinal, hilar, or axillary lymph nodes. Thyroid gland, trachea, and esophagus demonstrate no significant findings. Lungs/Pleura: Mild emphysema in the upper lungs. No focal consolidation, pleural effusion, or pneumothorax. Biapical pleural-parenchymal scarring. Musculoskeletal: No chest wall abnormality. No acute osseous findings. Review of the MIP  images confirms the above findings. CT ABDOMEN and PELVIS FINDINGS Hepatobiliary: Hepatic steatosis. Unremarkable gallbladder and biliary tree.  Pancreas: Unremarkable. Spleen: Unremarkable. Adrenals/Urinary Tract: Normal adrenal glands. Low-attenuation lesions in the kidneys are statistically likely to represent cysts. No follow-up is required. No urinary calculi or hydronephrosis. Unremarkable bladder. Stomach/Bowel: Stomach is within normal limits. Appendix appears normal. No evidence of bowel wall thickening, distention, or inflammatory changes. Vascular/Lymphatic: Aortic atherosclerosis. No enlarged abdominal or pelvic lymph nodes. Reproductive: Enlarged prostate. Other: No free intraperitoneal fluid or air. Musculoskeletal: No acute or significant osseous findings. IMPRESSION: 1. Negative for acute pulmonary embolism. 2. No acute abnormality in the chest, abdomen, or pelvis. 3. Hepatic steatosis. 4. Enlarged prostate. 5.  Aortic Atherosclerosis (ICD10-I70.0). Electronically Signed   By: Minerva Fester M.D.   On: 11/08/2022 17:56   CT Abdomen Pelvis W Contrast  Result Date: 11/08/2022 CLINICAL DATA:  Evaluation for new onset tremors, fatigue, muscle weakness, and difficulty focusing for 4-6 weeks. PE suspected. Acute nonlocalized abdominal pain. EXAM: CT ANGIOGRAPHY CHEST CT ABDOMEN AND PELVIS WITH CONTRAST TECHNIQUE: Multidetector CT imaging of the chest was performed using the standard protocol during bolus administration of intravenous contrast. Multiplanar CT image reconstructions and MIPs were obtained to evaluate the vascular anatomy. Multidetector CT imaging of the abdomen and pelvis was performed using the standard protocol during bolus administration of intravenous contrast. RADIATION DOSE REDUCTION: This exam was performed according to the departmental dose-optimization program which includes automated exposure control, adjustment of the mA and/or kV according to patient size and/or use of iterative reconstruction technique. CONTRAST:  75mL OMNIPAQUE IOHEXOL 350 MG/ML SOLN COMPARISON:  Chest radiographs 11/08/2022 FINDINGS: CTA CHEST  FINDINGS Cardiovascular: Satisfactory opacification of the pulmonary arteries to the segmental level. No evidence of pulmonary embolism. Normal heart size. No pericardial effusion. Mediastinum/Nodes: No enlarged mediastinal, hilar, or axillary lymph nodes. Thyroid gland, trachea, and esophagus demonstrate no significant findings. Lungs/Pleura: Mild emphysema in the upper lungs. No focal consolidation, pleural effusion, or pneumothorax. Biapical pleural-parenchymal scarring. Musculoskeletal: No chest wall abnormality. No acute osseous findings. Review of the MIP images confirms the above findings. CT ABDOMEN and PELVIS FINDINGS Hepatobiliary: Hepatic steatosis. Unremarkable gallbladder and biliary tree. Pancreas: Unremarkable. Spleen: Unremarkable. Adrenals/Urinary Tract: Normal adrenal glands. Low-attenuation lesions in the kidneys are statistically likely to represent cysts. No follow-up is required. No urinary calculi or hydronephrosis. Unremarkable bladder. Stomach/Bowel: Stomach is within normal limits. Appendix appears normal. No evidence of bowel wall thickening, distention, or inflammatory changes. Vascular/Lymphatic: Aortic atherosclerosis. No enlarged abdominal or pelvic lymph nodes. Reproductive: Enlarged prostate. Other: No free intraperitoneal fluid or air. Musculoskeletal: No acute or significant osseous findings. IMPRESSION: 1. Negative for acute pulmonary embolism. 2. No acute abnormality in the chest, abdomen, or pelvis. 3. Hepatic steatosis. 4. Enlarged prostate. 5.  Aortic Atherosclerosis (ICD10-I70.0). Electronically Signed   By: Minerva Fester M.D.   On: 11/08/2022 17:56   DG Chest 2 View  Result Date: 11/08/2022 CLINICAL DATA:  CP, SOB EXAM: CHEST - 2 VIEW COMPARISON:  06/06/2016 FINDINGS: Cardiac silhouette is unremarkable. No pneumothorax or pleural effusion. The lungs are clear. The visualized skeletal structures are unremarkable. IMPRESSION: No acute cardiopulmonary process.  Electronically Signed   By: Layla Maw M.D.   On: 11/08/2022 16:49   CT Head Wo Contrast  Result Date: 11/08/2022 CLINICAL DATA:  Mental status change, unknown cause. EXAM: CT HEAD WITHOUT CONTRAST TECHNIQUE: Contiguous axial images were obtained from the base of the skull through the vertex without intravenous contrast. RADIATION DOSE REDUCTION:  This exam was performed according to the departmental dose-optimization program which includes automated exposure control, adjustment of the mA and/or kV according to patient size and/or use of iterative reconstruction technique. COMPARISON:  None Available. FINDINGS: Brain: No acute hemorrhage, mass effect or midline shift. Gray-white differentiation is preserved. No hydrocephalus. No extra-axial collection. Basilar cisterns are patent. Vascular: No hyperdense vessel or unexpected calcification. Skull: No calvarial fracture or suspicious bone lesion. Skull base is unremarkable. Sinuses/Orbits: Mild mucosal disease and trace fluid within the right maxillary sinus. Orbits are unremarkable. Mastoids are well aerated. Other: None. IMPRESSION: 1. No acute intracranial abnormality. 2. Mild mucosal disease and trace fluid within the right maxillary sinus. Correlate for signs/symptoms of acute sinusitis. Electronically Signed   By: Orvan FalconerWalter  Wiggins M.D.   On: 11/08/2022 16:07    Microbiology: No results found for this or any previous visit.  Labs: CBC: Recent Labs  Lab 11/08/22 1430 11/09/22 0408  WBC 6.7 6.3  HGB 15.4 14.1  HCT 45.8 42.4  MCV 100.2* 100.7*  PLT 283 235   Basic Metabolic Panel: Recent Labs  Lab 11/08/22 1430 11/08/22 1951 11/09/22 0408 11/10/22 0429 11/12/22 0317  NA 138  --  138 135 135  K 4.2  --  3.8 3.6 4.0  CL 102  --  101 99 103  CO2 21*  --  24 24 27   GLUCOSE 94  --  101* 102* 118*  BUN 9  --  12 13 13   CREATININE 0.65  --  0.81 0.67 0.86  CALCIUM 8.8*  --  9.0 9.4 8.9  MG 2.2  --   --  2.2 2.0  PHOS  --  3.8  --   4.1  --    Liver Function Tests: Recent Labs  Lab 11/08/22 1551 11/09/22 0408 11/10/22 0429  AST 114* 102* 85*  ALT 85* 72* 66*  ALKPHOS 76 83 84  BILITOT 1.1 2.2* 2.3*  PROT 8.4* 7.7 7.9  ALBUMIN 4.6 3.9 4.3   CBG: No results for input(s): "GLUCAP" in the last 168 hours.  Discharge time spent: greater than 30 minutes.  Signed: Alford Highlandichard Taevion Sikora, MD [facility] 11/14/2022

## 2022-11-14 NOTE — Progress Notes (Signed)
Physical Therapy Treatment Patient Details Name: Lee Mueller MRN: [id number] DOB: [birth year] Today's Date: 11/14/2022   History of Present Illness Pt is a 57 y/o M admitted on 11/08/22 after presenting to the ED for c/c of worsening tremors & complete hair loss over the last month. Pt is being treated for AMS, alcohol withdrawal with delirium, & alcoholic hepatitis. PMH: generalized muscle weakness, tremor    PT Comments    Pt was standing in the middle of room upon arrival. Not using assistive device but was A and O x 4 and agreeable to session. He was able to ambulate without AD with supervision. Safely demonstrated abilities to ascend/ descend 2 FOS with +1 UE support. Overall pt is progressing quickly. Highly recommend continued skilled PT at DC to maximize independence and safety with all ADLs.    Recommendations for follow up therapy are one component of a multi-disciplinary discharge planning process, led by the attending physician.  Recommendations may be updated based on patient status, additional functional criteria and insurance authorization.     Assistance Recommended at Discharge PRN  Patient can return home with the following A little help with bathing/dressing/bathroom;A little help with walking and/or transfers;Assistance with cooking/housework;Assist for transportation;Help with stairs or ramp for entrance   Equipment Recommendations  Rolling walker (2 wheels)       Precautions / Restrictions Precautions Precautions: Fall Restrictions Weight Bearing Restrictions: No     Mobility  Bed Mobility Overal bed mobility: Modified Independent  General bed mobility comments: pt was standing in his room unsupervised upon entering room    Transfers Overall transfer level: Modified independent Equipment used: None Transfers: Sit to/from Stand Sit to Stand: Modified independent (Device/Increase time)   Ambulation/Gait Ambulation/Gait assistance: Modified independent  (Device/Increase time) Gait Distance (Feet): 200 Feet Assistive device: None Gait Pattern/deviations: Step-through pattern Gait velocity: decreased  General Gait Details: Pt was able to ambulate > 200 ft without AD however author does recommend use of one until balance returns to baseline   Stairs Stairs: Yes Stairs assistance: Supervision Stair Management: Forwards, Alternating pattern Number of Stairs: 26 General stair comments: pt was able to ascend/descend 2 FOS(13 steps) with +1 UE support only   Balance Overall balance assessment: Needs assistance Sitting-balance support: Feet supported Sitting balance-Leahy Scale: Good     Standing balance support: No upper extremity supported, During functional activity Standing balance-Leahy Scale: Fair       Cognition Arousal/Alertness: Awake/alert Behavior During Therapy: WFL for tasks assessed/performed Overall Cognitive Status: Within Functional Limits for tasks assessed            Pertinent Vitals/Pain Pain Assessment Pain Assessment: No/denies pain Pain Score: 0-No pain Pain Location: RLE, LBP Pain Descriptors / Indicators: Sore, Aching Pain Intervention(s): Limited activity within patient's tolerance, Monitored during session, Premedicated before session, Repositioned     PT Goals (current goals can now be found in the care plan section) Acute Rehab PT Goals Patient Stated Goal: get better Progress towards PT goals: Progressing toward goals    Frequency    Min 3X/week      PT Plan Discharge plan needs to be updated       AM-PAC PT "6 Clicks" Mobility   Outcome Measure  Help needed turning from your back to your side while in a flat bed without using bedrails?: None Help needed moving from lying on your back to sitting on the side of a flat bed without using bedrails?: A Little Help needed moving to and from a  bed to a chair (including a wheelchair)?: A Little Help needed standing up from a chair using  your arms (e.g., wheelchair or bedside chair)?: A Little Help needed to walk in hospital room?: A Little Help needed climbing 3-5 steps with a railing? : A Little 6 Click Score: 19    End of Session   Activity Tolerance: Patient tolerated treatment well Patient left: in bed;with call bell/phone within reach Nurse Communication: Mobility status PT Visit Diagnosis: Unsteadiness on feet (R26.81);Other symptoms and signs involving the nervous system (R29.898);Other abnormalities of gait and mobility (R26.89);Difficulty in walking, not elsewhere classified (R26.2);Muscle weakness (generalized) (M62.81)     Time: 8832-5498 PT Time Calculation (min) (ACUTE ONLY): 9 min  Charges:  $Gait Training: 8-22 mins                     Jetta Lout PTA 11/14/22, 1:17 PM

## 2022-11-14 NOTE — TOC Transition Note (Addendum)
Transition of Care [facility]) - CM/SW Discharge Note   Patient Details  Name: Lee Mueller MRN: [id number] Date of Birth: July 15, 1966  Transition of [facility]) CM/SW Contact:  Allena Katz, LCSW Phone Number: 11/14/2022, 11:51 AM   Clinical Narrative:   CSW spoke with patient about rehab options. Rehab reports pt insurance does NOT cover rehab and pt would have to private pay. Pt does not want to do this. Pt was interested in a RW but [employer] stated he would have a copay. Pt is not interested in this. Resources for PCP's added to patients AVS as well as substance use resources. CSW wlll call for transport via taxi at patients request.           Patient Goals and CMS Choice      Discharge Placement                         Discharge Plan and Services Additional resources added to the After Visit Summary for                                       Social Determinants of Health (SDOH) Interventions SDOH Screenings   Food Insecurity: No Food Insecurity (11/10/2022)  Housing: Low Risk  (11/10/2022)  Transportation Needs: Unmet Transportation Needs (11/10/2022)  Utilities: Not At Risk (11/10/2022)  Tobacco Use: Medium Risk (11/08/2022)     Readmission Risk Interventions     No data to display

## 2022-11-14 NOTE — Assessment & Plan Note (Signed)
Continue vitamin D supplementation 

## 2022-11-14 NOTE — Progress Notes (Signed)
Discharge instructions reviewed with patient including followup visits and new medications.  Understanding was verbalized and all questions were answered.  IV removed without complication; patient tolerated well.  Patient discharged home via wheelchair in stable condition escorted by volunteer staff.  

## 2022-11-14 NOTE — Assessment & Plan Note (Signed)
Patient went through alcohol withdrawal during hospital course.

## 2022-11-14 NOTE — Assessment & Plan Note (Signed)
Continue supplements

## 2022-11-14 NOTE — Assessment & Plan Note (Signed)
Prescribed Norvasc.

## 2022-11-14 NOTE — Progress Notes (Signed)
Mobility Specialist - Progress Note   11/14/22 0832  Mobility  Activity Ambulated with assistance in hallway;Stood at bedside;Dangled on edge of bed  Level of Assistance Standby assist, set-up cues, supervision of patient - no hands on  Assistive Device Front wheel walker  Distance Ambulated (ft) 100 ft  Activity Response Tolerated well  Mobility Referral Yes  $Mobility charge 1 Mobility   Pt supine in bed on RA upon arrival. Pt completes bed mobility indep. STS and ambulates in hallway SBA with no LOB noted. Pt returns to bed with needs in reach.   Terrilyn Saver  Mobility Specialist  11/14/22 8:34 AM

## 2022-11-14 NOTE — Assessment & Plan Note (Signed)
Imaging of the brain and cervical spine and lumbar spine does not account for his weakness.  Doing better with physical therapy.  Likely secondary to alcohol.

## 2022-11-14 NOTE — Assessment & Plan Note (Signed)
Secondary to alcohol.  Completed withdrawal protocol.  No signs of withdrawal upon discharge.

## 2022-11-15 LAB — MISC LABCORP TEST (SEND OUT)
LabCorp test name: 140640
Labcorp test code: 140640

## 2022-11-16 LAB — METHYLMALONIC ACID, SERUM: Methylmalonic Acid, Quantitative: 176 nmol/L (ref 0–378)

## 2022-11-28 ENCOUNTER — Ambulatory Visit: Payer: Self-pay | Admitting: Nurse Practitioner

## 2022-11-30 ENCOUNTER — Ambulatory Visit: Payer: Self-pay | Admitting: Nurse Practitioner

## 2022-12-03 ENCOUNTER — Ambulatory Visit: Payer: Self-pay | Admitting: Nurse Practitioner

## 2022-12-07 ENCOUNTER — Ambulatory Visit: Payer: Self-pay | Admitting: Nurse Practitioner

## 2022-12-13 ENCOUNTER — Ambulatory Visit: Payer: Self-pay | Admitting: Nurse Practitioner

## 2022-12-21 ENCOUNTER — Ambulatory Visit: Payer: Self-pay | Admitting: Internal Medicine

## 2022-12-25 ENCOUNTER — Ambulatory Visit: Payer: Self-pay | Admitting: Internal Medicine

## 2023-07-08 ENCOUNTER — Emergency Department
Admission: EM | Admit: 2023-07-08 | Discharge: 2023-07-08 | Disposition: A | Discharge status: Home / Self Care | Payer: 59 | Attending: Emergency Medicine | Admitting: Emergency Medicine

## 2023-07-08 ENCOUNTER — Other Ambulatory Visit: Payer: Self-pay

## 2023-07-08 DIAGNOSIS — F17210 Nicotine dependence, cigarettes, uncomplicated: Secondary | ICD-10-CM | POA: Diagnosis not present

## 2023-07-08 DIAGNOSIS — M546 Pain in thoracic spine: Secondary | ICD-10-CM | POA: Insufficient documentation

## 2023-07-08 DIAGNOSIS — M25512 Pain in left shoulder: Secondary | ICD-10-CM | POA: Diagnosis not present

## 2023-07-08 DIAGNOSIS — I10 Essential (primary) hypertension: Secondary | ICD-10-CM | POA: Diagnosis not present

## 2023-07-08 DIAGNOSIS — M25531 Pain in right wrist: Secondary | ICD-10-CM | POA: Insufficient documentation

## 2023-07-08 DIAGNOSIS — M7918 Myalgia, other site: Secondary | ICD-10-CM

## 2023-07-08 DIAGNOSIS — M791 Myalgia, unspecified site: Secondary | ICD-10-CM | POA: Diagnosis not present

## 2023-07-08 MED ORDER — MELOXICAM 15 MG PO TABS: 15.0000 mg | ORAL_TABLET | Freq: Every day | ORAL | 0 refills | Status: DC

## 2023-07-08 MED ORDER — KETOROLAC TROMETHAMINE 30 MG/ML IJ SOLN
30.0000 mg | Freq: Once | INTRAMUSCULAR | Status: AC
  Administered 2023-07-08: 30 mg via INTRAMUSCULAR
  Filled 2023-07-08: qty 1

## 2023-07-08 NOTE — ED Notes (Addendum)
See triage note  Presents with hand/arm pain which is now moving into back  Denies any injury  Ambulates well to treatment area   States sxs' started several months ago

## 2023-07-08 NOTE — ED Triage Notes (Signed)
Pt comes with c/o back pain, arm pain and hand pain. Pt states it started in hand and now has worked it way up to his wrist, elbow, shoulders and now back. Pt states this has been going on for awhile but he has put it off.   Pt denies any injuries.

## 2023-07-08 NOTE — Discharge Instructions (Addendum)
[facility] to make an appointment.  There is also a list of clinics on your discharge papers that are taking new patients.  A prescription for meloxicam was sent to the pharmacy for you to take with food once daily to replace the ibuprofen which may cause stomach upset.  Discontinue taking this medication if you develop any stomach discomfort.  Please go to the following website to schedule new (and existing) patient appointments:   [email or web address]   The following is a list of primary care offices in the area who are accepting new patients at this time.  Please reach out to one of them directly and let them know you would like to schedule an appointment to follow up on an Emergency Department visit, and/or to establish a new primary care provider (PCP).  There are likely other primary care clinics in the are who are accepting new patients, but this is an excellent place to start:  [facility] Lead physician: Dr Shirlee Latch [address] [place], [place] [address] [phone]  [facility] Lead Physician: Dr Alba Cory [address], [place], [place] [address] [phone]  [facility]  Lead Physician: Dr Olevia Perches [address] [place], [place] [address] [phone]  [facility] Lead Physician: Dr Sofie Hartigan [address] [place], [place] [address] [phone]  [facility] at [facility] Lead Physician: Dr Bari Edward [address] [place], [place], [place] [address] [phone]

## 2023-07-08 NOTE — ED Provider Notes (Signed)
[facility] Provider Note    Event Date/Time   First MD Initiated Contact with Patient 07/08/23 [phone]     (approximate)   History   Back Pain   HPI  Lee Mueller is a 57 y.o. male   presents to the ED with complaint of right wrist pain, left shoulder pain, and back pain for several months.  Patient is also have problems with his left elbow.  He states that he wakes up stiff and difficult to move or get dressed.  This gets better during the day.  Occasionally he takes ibuprofen and yesterday he took 2 ibuprofen OTC.  He states that he infrequently is able to take medication as [facility] and he also does not have PCP.  He denies any injuries.  Patient still continues to smoke half pack cigarettes per day but reports that he has not had any alcohol in the last 3 months.  Patient has a history of hypertension but currently is not taking his medication as he does not have PCP.  [facility] and states he does not have PCP.      Physical Exam   Triage Vital Signs: ED Triage Vitals  Encounter Vitals Group     BP 07/08/23 0733 (!) 158/98     Systolic BP Percentile --      Diastolic BP Percentile --      Pulse Rate 07/08/23 0733 71     Resp 07/08/23 0733 18     Temp 07/08/23 0733 97.7 F (36.5 C)     Temp Source 07/08/23 0733 Oral     SpO2 07/08/23 0733 95 %     Weight 07/08/23 0734 150 lb (68 kg)     Height 07/08/23 0734 5\' 9"  (1.753 m)     Head Circumference --      Peak Flow --      Pain Score 07/08/23 0737 7     Pain Loc --      Pain Education --      Exclude from Growth Chart --     Most recent vital signs: Vitals:   07/08/23 0733 07/08/23 0843  BP: (!) 158/98 (!) 178/85  Pulse: 71 70  Resp: 18 18  Temp: 97.7 F (36.5 C)   SpO2: 95% 97%     General: Awake, no distress.  CV:  Good peripheral perfusion.  Heart regular rate and rhythm. Resp:  Normal effort.  Lungs clear  bilaterally. Abd:  No distention.  Other:  No soft tissue edema noted to the hand, wrist, fingers or skin discoloration.  Radial pulse present.  Capillary fill is less than 3 seconds.  Skin is intact.  Left shoulder and elbow range of motion is without restriction and no crepitus is appreciated.  There is diffuse tenderness on palpation of the anterior and posterior left shoulder.  Tenderness on palpation of the thoracic spine without step-offs or deviations.  Patient is able to stand and ambulate without any assistance.   ED Results / Procedures / Treatments   Labs (all labs ordered are listed, but only abnormal results are displayed) Labs Reviewed - No data to display    PROCEDURES:  Critical Care performed:   Procedures   MEDICATIONS ORDERED IN ED: Medications  ketorolac (TORADOL) 30 MG/ML injection 30 mg (30 mg Intramuscular Given 07/08/23 0804)     IMPRESSION / MDM / ASSESSMENT AND PLAN / ED COURSE  I reviewed the triage vital signs and the nursing notes.   Differential diagnosis includes, but is not limited to, osteoarthritis, musculoskeletal pain, musculoskeletal strain.  57 year old male presents to the ED with complaint of musculoskeletal pain for several months without history of injury.  He has infrequently taken NSAIDs.  He denies any stomach issues and has discontinued drinking alcohol.  Patient was given Toradol 30 mg IM while in the ED and a prescription for meloxicam was sent to the pharmacy for him to take once a day.  A list of medical clinics and also information about the [facility] was printed along with his discharge instructions.  Patient is strongly encouraged to follow-up with 1 of these and to make an appointment.      Patient's presentation is most consistent with acute illness / injury with system symptoms.  FINAL CLINICAL IMPRESSION(S) / ED DIAGNOSES   Final diagnoses:  Musculoskeletal pain     Rx / DC Orders   ED Discharge Orders           Ordered    meloxicam (MOBIC) 15 MG tablet  Daily        07/08/23 0844             Note:  This document was prepared using Dragon voice recognition software and may include unintentional dictation errors.   Tommi Rumps, PA-C 07/08/23 1033    Jene Every, MD 07/08/23 1050

## 2023-08-19 ENCOUNTER — Encounter: Payer: Self-pay | Admitting: Family Medicine

## 2023-08-19 ENCOUNTER — Ambulatory Visit: Payer: No Typology Code available for payment source | Admitting: Family Medicine

## 2023-08-19 VITALS — BP 170/95 | HR 75 | Ht 69.0 in

## 2023-08-19 DIAGNOSIS — F1011 Alcohol abuse, in remission: Secondary | ICD-10-CM | POA: Insufficient documentation

## 2023-08-19 DIAGNOSIS — R251 Tremor, unspecified: Secondary | ICD-10-CM | POA: Diagnosis not present

## 2023-08-19 DIAGNOSIS — M48061 Spinal stenosis, lumbar region without neurogenic claudication: Secondary | ICD-10-CM

## 2023-08-19 DIAGNOSIS — M545 Low back pain, unspecified: Secondary | ICD-10-CM

## 2023-08-19 DIAGNOSIS — E559 Vitamin D deficiency, unspecified: Secondary | ICD-10-CM

## 2023-08-19 DIAGNOSIS — F1021 Alcohol dependence, in remission: Secondary | ICD-10-CM

## 2023-08-19 DIAGNOSIS — M4802 Spinal stenosis, cervical region: Secondary | ICD-10-CM

## 2023-08-19 DIAGNOSIS — M79605 Pain in left leg: Secondary | ICD-10-CM

## 2023-08-19 DIAGNOSIS — Z7689 Persons encountering health services in other specified circumstances: Secondary | ICD-10-CM

## 2023-08-19 DIAGNOSIS — F322 Major depressive disorder, single episode, severe without psychotic features: Secondary | ICD-10-CM

## 2023-08-19 DIAGNOSIS — M2550 Pain in unspecified joint: Secondary | ICD-10-CM | POA: Diagnosis not present

## 2023-08-19 DIAGNOSIS — E782 Mixed hyperlipidemia: Secondary | ICD-10-CM

## 2023-08-19 DIAGNOSIS — I10 Essential (primary) hypertension: Secondary | ICD-10-CM

## 2023-08-19 DIAGNOSIS — M79604 Pain in right leg: Secondary | ICD-10-CM

## 2023-08-19 MED ORDER — FOLIC ACID 1 MG PO TABS
1.0000 mg | ORAL_TABLET | Freq: Every day | ORAL | 0 refills | Status: DC
Start: 2023-08-19 — End: 2023-09-18

## 2023-08-19 MED ORDER — CYCLOBENZAPRINE HCL 5 MG PO TABS
5.0000 mg | ORAL_TABLET | Freq: Every evening | ORAL | 1 refills | Status: DC | PRN
Start: 2023-08-19 — End: 2023-12-24

## 2023-08-19 MED ORDER — THIAMINE HCL 100 MG PO TABS: 100.0000 mg | ORAL_TABLET | Freq: Every day | ORAL | 1 refills | Status: DC

## 2023-08-19 MED ORDER — AMLODIPINE BESYLATE 5 MG PO TABS
5.0000 mg | ORAL_TABLET | Freq: Every day | ORAL | 0 refills | Status: DC
Start: 2023-08-19 — End: 2023-11-04

## 2023-08-19 MED ORDER — ADULT MULTIVITAMIN W/MINERALS CH: 1.0000 | ORAL_TABLET | Freq: Every day | ORAL | Status: DC

## 2023-08-19 NOTE — Progress Notes (Signed)
 New patient visit   Patient: Lee Mueller   DOB: 1966/06/05   58 y.o. Male  MRN: [id number] Visit Date: 08/19/2023  Today's healthcare provider: LAURAINE LOISE BUOY, DO   Chief Complaint  Patient presents with   New Patient (Initial Visit)   Subjective    Lee Mueller is a 58 y.o. male who presents today as a new patient to establish care.  HPI  The patient, a 58 year old individual with a history of hypertension, presented with a chief complaint of widespread joint pain that has been progressively worsening over the past few months. The pain, described as aching, is most prominent in the mornings and makes it difficult for the patient to get dressed. The pain is located in the fingers, wrists, elbows, shoulders, and back. The patient also reported some numbness and tingling in the extremities.  The patient's pain began after a fall at work last year, which resulted in a period of hospitalization. Since then, the patient has noticed a gradual increase in pain and difficulty with mobility. The patient reported that the pain seems to have moved from the lower body to the upper body over time. The patient also reported difficulty getting up from a seated position due to pain in the lower back and hips.  The patient has been unable to obtain his medications due to financial constraints and a change in insurance. The patient is currently staying at [facility] and is awaiting the results of a disability claim. He has also filed for unemployment. The patient has a history of alcohol use but has been sober since September of the previous year.  He continues to smoke a half pack of cigarettes per day.  The patient has not been able to follow up with neurology as recommended and has not been taking prescribed vitamins. The patient's diet consists mainly of hoagies, soup, and pre-packaged meals. The patient reported some changes in vision and dark urine, which he attributed to increased  coffee consumption.  The patient has been experiencing significant stress due to his current living situation and health issues, which has resulted in high scores on depression and anxiety screenings. However, the patient has declined referral for therapy at this time, preferring to rely on support from his daughter.   Past Medical History:  Diagnosis Date   Alcohol use    History reviewed. No pertinent surgical history. Family Status  Relation Name Status   Mother  (Not Specified)  No partnership data on file   Family History  Problem Relation Age of Onset   Breast cancer Mother    Social History   Socioeconomic History   Marital status: Single    Spouse name: Not on file   Number of children: Not on file   Years of education: Not on file   Highest education level: Not on file  Occupational History   Not on file  Tobacco Use   Smoking status: Every Day    Current packs/day: 0.50    Types: Cigarettes   Smokeless tobacco: Never  Vaping Use   Vaping status: Never Used  Substance and Sexual Activity   Alcohol use: Not Currently    Comment: 3 months sober   Drug use: Never   Sexual activity: Not Currently  Other Topics Concern   Not on file  Social History Narrative   Not on file   Social Drivers of Health   Financial Resource Strain: Not on file  Food Insecurity: No Food Insecurity (11/10/2022)  Hunger Vital Sign    Worried About Running Out of Food in the Last Year: Never true    Ran Out of Food in the Last Year: Never true  Transportation Needs: Unmet Transportation Needs (11/10/2022)   PRAPARE - Administrator, Civil Service (Medical): Yes    Lack of Transportation (Non-Medical): No  Physical Activity: Not on file  Stress: Not on file  Social Connections: Not on file   Outpatient Medications Prior to Visit  Medication Sig   ascorbic acid  (VITAMIN C ) 250 MG tablet Take 1 tablet (250 mg total) by mouth daily. (Patient not taking: Reported on  08/19/2023)   cholecalciferol  (VITAMIN D3) 25 MCG (1000 UNIT) tablet Take 1 tablet (1,000 Units total) by mouth daily. (Patient not taking: Reported on 08/19/2023)   cyanocobalamin  1000 MCG tablet Take 1 tablet (1,000 mcg total) by mouth daily. (Patient not taking: Reported on 08/19/2023)   feeding supplement (ENSURE ENLIVE / ENSURE PLUS) LIQD Take 237 mLs by mouth 2 (two) times daily between meals. (Patient not taking: Reported on 08/19/2023)   meloxicam  (MOBIC ) 15 MG tablet Take 1 tablet (15 mg total) by mouth daily. (Patient not taking: Reported on 08/19/2023)   polyethylene glycol (MIRALAX  / GLYCOLAX ) 17 g packet Take 17 g by mouth daily as needed for moderate constipation. (Patient not taking: Reported on 08/19/2023)   [DISCONTINUED] amLODipine  (NORVASC ) 5 MG tablet Take 1 tablet (5 mg total) by mouth daily. (Patient not taking: Reported on 08/19/2023)   [DISCONTINUED] folic acid  (FOLVITE ) 1 MG tablet Take 1 tablet (1 mg total) by mouth daily. (Patient not taking: Reported on 08/19/2023)   [DISCONTINUED] Multiple Vitamin (MULTIVITAMIN WITH MINERALS) TABS tablet Take 1 tablet by mouth daily. (Patient not taking: Reported on 08/19/2023)   [DISCONTINUED] thiamine  (VITAMIN B1) 100 MG tablet Take 1 tablet (100 mg total) by mouth daily. (Patient not taking: Reported on 08/19/2023)   No facility-administered medications prior to visit.   No Known Allergies   There is no immunization history on file for this patient.  Health Maintenance  Topic Date Due   Pneumococcal Vaccine 14-12 Years old (1 of 2 - PCV) Never done   DTaP/Tdap/Td (1 - Tdap) Never done   Colonoscopy  Never done   Zoster Vaccines- Shingrix  (1 of 2) Never done   INFLUENZA VACCINE  Never done   COVID-19 Vaccine (1 - 2024-25 season) Never done   Hepatitis C Screening  Completed   HIV Screening  Completed   HPV VACCINES  Aged Out    Patient Care Team: Ariana Cavenaugh, Lauraine SAILOR, DO as PCP - General (Family Medicine)  Review of Systems       Objective    BP (!) 170/95 (BP Location: Right Arm, Patient Position: Sitting, Cuff Size: Normal)   Pulse 75   Ht 5' 9 (1.753 m)   SpO2 100%   BMI 22.15 kg/m     Physical Exam Vitals and nursing note reviewed.  Constitutional:      General: He is not in acute distress.    Appearance: Normal appearance.  HENT:     Head: Normocephalic and atraumatic.  Eyes:     General: No scleral icterus.    Conjunctiva/sclera: Conjunctivae normal.  Cardiovascular:     Rate and Rhythm: Normal rate.  Pulmonary:     Effort: Pulmonary effort is normal.  Musculoskeletal:     Right shoulder: Tenderness present. No bony tenderness. Decreased range of motion (d/t pain).     Left shoulder:  Tenderness present. No bony tenderness. Decreased range of motion (d/t pain).       Arms:       Legs:     Comments: Pain in areas marked in red during straight leg raise, nonradiating, worse on left side. Mild pain in area marked with blue on palpation [place] with abduction in areas marked in green anteriorly and pain to palpation in areas marked in green posteriorly  Neurological:     Mental Status: He is alert and oriented to person, place, and time. Mental status is at baseline.  Psychiatric:        Mood and Affect: Mood normal.        Behavior: Behavior normal.     MRI L-spine - 11/10/2022 IMPRESSION: 1. L1-L2 mild spinal canal stenosis. No neural foraminal narrowing. 2. L4-L5 mild bilateral neural foraminal narrowing. 3. L5-S1 mild right neural foraminal narrowing. 4. Multilevel facet arthropathy, which can be a cause of back pain.  MRI - [place] and C-spine 11/08/2022 1. No acute intracranial process. No intracranial contrast enhancing lesion or abnormal contrast enhanacement in the cervical spine. 2. Multilevel cervical spine degenerative changes with up to moderate bilateral neural foraminal narrowing at C3-C4 and on the right at C5-C6. 3. Mild spinal canal stenosis at C3-C4, C4-C5, and  C5-C6.   Depression Screen    08/19/2023   10:35 AM  PHQ 2/9 Scores  PHQ - 2 Score 6  PHQ- 9 Score 18   Results for orders placed or performed in visit on 08/19/23  Comprehensive metabolic panel  Result Value Ref Range   Glucose 102 (H) 70 - 99 mg/dL   BUN 13 6 - 24 mg/dL   Creatinine, Ser 9.10 0.76 - 1.27 mg/dL   eGFR 99 >40 fO/fpw/8.26   BUN/Creatinine Ratio 15 9 - 20   Sodium 139 134 - 144 mmol/L   Potassium 4.8 3.5 - 5.2 mmol/L   Chloride 101 96 - 106 mmol/L   CO2 22 20 - 29 mmol/L   Calcium  9.8 8.7 - 10.2 mg/dL   Total Protein 7.6 6.0 - 8.5 g/dL   Albumin 5.0 (H) 3.8 - 4.9 g/dL   Globulin, Total 2.6 1.5 - 4.5 g/dL   Bilirubin Total 0.5 0.0 - 1.2 mg/dL   Alkaline Phosphatase 89 44 - 121 IU/L   AST 16 0 - 40 IU/L   ALT 12 0 - 44 IU/L  Lipid panel  Result Value Ref Range   Cholesterol, Total 170 100 - 199 mg/dL   Triglycerides 887 0 - 149 mg/dL   HDL 34 (L) >60 mg/dL   VLDL Cholesterol Cal 21 5 - 40 mg/dL   LDL Chol Calc (NIH) 884 (H) 0 - 99 mg/dL   Chol/HDL Ratio 5.0 0.0 - 5.0 ratio  ANA 12Plus Profile, Do All RDL  Result Value Ref Range   Anti-Nuclear Ab by IFA (RDL) Positive (A) Negative   Anti-Centromere Ab (RDL) <1:40 <1:40   Anti-dsDNA Ab by Farr(RDL) <8.0 <8.0 IU/mL   Anti-Sm Ab (RDL) <20 <20 Units   Anti-U1 RNP Ab (RDL) <20 <20 Units   Anti-Ro (SS-A) Ab (RDL) 99 (H) <20 Units   Anti-La (SS-B) Ab (RDL) <20 <20 Units   Anti-Scl-70 Ab (RDL) <20 <20 Units   Anti-Cardiolipin Ab, IgG (RDL) <15 <15 GPL U/mL   Anti-Cardiolipin Ab, IgA (RDL) <12 <12 APL U/mL   Anti-Cardiolipin Ab, IgM (RDL) <13 <13 MPL U/mL   C3 Complement (RDL) 182 (H) 90 - 180 mg/dL  C4 Complement (RDL) 31 14 - 44 mg/dL   Anti-TPO Ab (RDL) WILL FOLLOW    Anti-Chromatin Ab, IgG (RDL) <20 <20 Units   Anti-CCP Ab, IgG & IgA (RDL) <20 <20 Units   Rheumatoid Factor by Turb RDL CANCELED IU/mL  Sed Rate (ESR)  Result Value Ref Range   Sed Rate 7 0 - 30 mm/hr  Rheumatoid Factor  Result Value  Ref Range   Rheumatoid fact SerPl-aCnc <10.0 <14.0 IU/mL  VITAMIN D  25 Hydroxy (Vit-D Deficiency, Fractures)  Result Value Ref Range   Vit D, 25-Hydroxy 13.9 (L) 30.0 - 100.0 ng/mL  ANA Titer and Pattern  Result Value Ref Range   Speckled Pattern 1:80 (H) <1:40   Note: Comment     Assessment & Plan     Multiple joint pain Assessment & Plan: Chronic pain in multiple joints (hands, elbows, shoulders, back) exacerbated by movement, affecting daily activities. History of falls and possible arthritis. Differential includes rheumatological conditions and spinal stenosis. Previous meloxicam  was ineffective. Discussed Flexeril  for muscle relaxation and need for orthopedic evaluation. Blood work to identify underlying rheumatological issues. Patient prefers accessible appointments and medications via bus route. - Order blood work: metabolic panel, liver function, kidney function, electrolytes, cholesterol panel, rheumatological markers - Refer to orthopedics for further evaluation - Prescribe Flexeril  (cyclobenzaprine ) for muscle relaxation, to be taken at night - Refer to social work for assistance with medication access  Orders: -     AMB Referral VBCI Care Management -     ANA 12Plus Profile, Do All RDL -     Sedimentation rate -     Rheumatoid factor -     Cyclobenzaprine  HCl; Take 1-2 tablets (5-10 mg total) by mouth at bedtime as needed for muscle spasms.  Dispense: 60 tablet; Refill: 1 -     Ambulatory referral to Orthopedic Surgery -     ANA Titer and Pattern  Establishing care with new doctor, encounter for  Essential hypertension Assessment & Plan: Elevated blood pressure noted. Non-compliance with antihypertensive medication due to financial constraints. Discussed importance of medication adherence and potential benefits of amlodipine . Patient prefers to fill prescriptions at [facility]. - Prescribe amlodipine , 90-day supply, to be filled at [facility] - Follow up in 3-4 weeks to  reassess blood pressure  Orders: -     AMB Referral VBCI Care Management -     Comprehensive metabolic panel -     Lipid panel -     amLODIPine  Besylate; Take 1 tablet (5 mg total) by mouth daily.  Dispense: 90 tablet; Refill: 0  Low back pain radiating to both legs Assessment & Plan: Will send cyclobenzaprine  to facilitate sleep as noted. Encouraged patient to do gentle stretching/strengthening exercises. Referred to ortho as noted.  Orders: -     AMB Referral VBCI Care Management -     Cyclobenzaprine  HCl; Take 1-2 tablets (5-10 mg total) by mouth at bedtime as needed for muscle spasms.  Dispense: 60 tablet; Refill: 1 -     Ambulatory referral to Orthopedic Surgery  Tremor of both hands Assessment & Plan: Tremor with history of alcohol dependence. Suspect sequelae of long-term poor nutrition. Will check vitamin levels as noted under alcohol dependence. Encouraged patient to take a regular multivitamin beginning as soon as financially feasible. Will send prescriptions for any resultant deficiencies.   Vitamin D  deficiency -     VITAMIN D  25 Hydroxy (Vit-D Deficiency, Fractures)  Neuroforaminal stenosis of cervical spine Assessment & Plan: Seen on MRI.  Referred to orthopedics as noted.  Orders: -     Ambulatory referral to Orthopedic Surgery  Neuroforaminal stenosis of lumbar spine Assessment & Plan: Seen on MRI. Referred to orthopedics as noted.  Orders: -     Ambulatory referral to Orthopedic Surgery  Moderately severe major depressive disorder, single episode (HCC) Assessment & Plan: High scores on depression and anxiety screenings (PHQ-9 score 18 and GAD-7 score 13). Significant psychosocial stressors (unemployment, disability pending, unstable housing). Patient expresses feelings of depression related to current circumstances. Discussed potential benefits of therapy and virtual therapy options. Patient to consider and follow up if interested. - Discuss potential  referral to therapy, patient to consider and follow up if interested - Provide information on virtual therapy options   History of alcohol dependence (HCC) Assessment & Plan: Denies current use. Did not take recommended supplements after hospitalization; will check levels today.  Orders: -     Thiamine  HCl; Take 1 tablet (100 mg total) by mouth daily.  Dispense: 30 tablet; Refill: 1 -     multivitamin with minerals; Take 1 tablet by mouth daily. -     Folic Acid ; Take 1 tablet (1 mg total) by mouth daily.  Dispense: 30 tablet; Refill: 0  General Health Maintenance Low vitamin D  and previous alcohol use. Current diet may lack essential nutrients. Discussed importance of balanced diet and supplementation to address potential deficiencies. - Recommend multivitamin and thiamine  supplementation - Encourage balanced diet with adequate nutrition   Return in about 3 weeks (around 09/09/2023) for HTN, pain.     I discussed the assessment and treatment plan with the patient  The patient was provided an opportunity to ask questions and all were answered. The patient agreed with the plan and demonstrated an understanding of the instructions.   The patient was advised to call back or seek an in-person evaluation if the symptoms worsen or if the condition fails to improve as anticipated.  There are other unrelated non-urgent complaints, but due to the busy schedule and the amount of time I've already spent with him, time does not permit me to address these routine issues at today's visit. I've requested another appointment to review these additional issues.     LAURAINE LOISE BUOY, DO  [facility] [phone] (phone) [phone] (fax)  [facility]

## 2023-08-20 ENCOUNTER — Telehealth: Payer: Self-pay | Admitting: *Deleted

## 2023-08-20 NOTE — Progress Notes (Signed)
 Complex Care Management Note Care Guide Note  08/20/2023 Name: [name] MRN: [id number] DOB: [birth year]   Complex Care Management Outreach Attempts: An unsuccessful telephone outreach was attempted today to offer the patient information about available complex care management services.  Follow Up Plan:  Additional outreach attempts will be made to offer the patient complex care management information and services.   Encounter Outcome:  No Answer  Thedford Franks, CCMA Care Coordination Care Guide Direct Dial: [phone]

## 2023-08-21 NOTE — Progress Notes (Signed)
Complex Care Management Note Care Guide Note  08/21/2023 Name: Lee Mueller MRN: [id number] DOB: 1965/12/20   Complex Care Management Outreach Attempts: A second unsuccessful outreach was attempted today to offer the patient with information about available complex care management services.  Follow Up Plan:  Additional outreach attempts will be made to offer the patient complex care management information and services.   Encounter Outcome:  No Answer  Burman Nieves, CMA, Care Guide [facility]  [facility], [facility] Guide Direct Dial: [phone]  Fax: [phone] Website: [email or web address]

## 2023-08-22 NOTE — Progress Notes (Signed)
Complex Care Management Note Care Guide Note  08/22/2023 Name: Lee Mueller MRN: [id number] DOB: [birth year]   Complex Care Management Outreach Attempts: A third unsuccessful outreach was attempted today to offer the patient with information about available complex care management services.  Follow Up Plan:  No further outreach attempts will be made at this time. We have been unable to contact the patient to offer or enroll patient in complex care management services.  Encounter Outcome:  No Answer  Burman Nieves, CMA, Care Guide [facility]  [facility], [facility] Guide Direct Dial: [phone]  Fax: [phone] Website: [email or web address]

## 2023-08-24 ENCOUNTER — Encounter: Payer: Self-pay | Admitting: Family Medicine

## 2023-08-24 DIAGNOSIS — M2550 Pain in unspecified joint: Secondary | ICD-10-CM | POA: Insufficient documentation

## 2023-08-24 DIAGNOSIS — M4802 Spinal stenosis, cervical region: Secondary | ICD-10-CM | POA: Insufficient documentation

## 2023-08-24 DIAGNOSIS — M48061 Spinal stenosis, lumbar region without neurogenic claudication: Secondary | ICD-10-CM | POA: Insufficient documentation

## 2023-08-24 DIAGNOSIS — Z7689 Persons encountering health services in other specified circumstances: Secondary | ICD-10-CM | POA: Insufficient documentation

## 2023-08-24 DIAGNOSIS — M79604 Pain in right leg: Secondary | ICD-10-CM | POA: Insufficient documentation

## 2023-08-24 DIAGNOSIS — F322 Major depressive disorder, single episode, severe without psychotic features: Secondary | ICD-10-CM | POA: Insufficient documentation

## 2023-08-24 MED ORDER — VITAMIN D (ERGOCALCIFEROL) 1.25 MG (50000 UNIT) PO CAPS: 50000.0000 [IU] | ORAL_CAPSULE | ORAL | 1 refills | Status: AC

## 2023-08-24 MED ORDER — ROSUVASTATIN CALCIUM 5 MG PO TABS
5.0000 mg | ORAL_TABLET | Freq: Every day | ORAL | 3 refills | Status: AC
Start: 2023-08-24 — End: ?

## 2023-08-24 NOTE — Assessment & Plan Note (Signed)
Elevated blood pressure noted. Non-compliance with antihypertensive medication due to financial constraints. Discussed importance of medication adherence and potential benefits of amlodipine. Patient prefers to fill prescriptions at [facility]. - Prescribe amlodipine, 90-day supply, to be filled at [facility] - Follow up in 3-4 weeks to reassess blood pressure

## 2023-08-24 NOTE — Assessment & Plan Note (Signed)
Seen on MRI. Referred to orthopedics as noted.

## 2023-08-24 NOTE — Assessment & Plan Note (Signed)
Chronic pain in multiple joints (hands, elbows, shoulders, back) exacerbated by movement, affecting daily activities. History of falls and possible arthritis. Differential includes rheumatological conditions and spinal stenosis. Previous meloxicam was ineffective. Discussed Flexeril for muscle relaxation and need for orthopedic evaluation. Blood work to identify underlying rheumatological issues. Patient prefers accessible appointments and medications via bus route. - Order blood work: metabolic panel, liver function, kidney function, electrolytes, cholesterol panel, rheumatological markers - Refer to orthopedics for further evaluation - Prescribe Flexeril (cyclobenzaprine) for muscle relaxation, to be taken at night - Refer to social work for assistance with medication access

## 2023-08-24 NOTE — Assessment & Plan Note (Signed)
High scores on depression and anxiety screenings (PHQ-9 score 18 and GAD-7 score 13). Significant psychosocial stressors (unemployment, disability pending, unstable housing). Patient expresses feelings of depression related to current circumstances. Discussed potential benefits of therapy and virtual therapy options. Patient to consider and follow up if interested. - Discuss potential referral to therapy, patient to consider and follow up if interested - Provide information on virtual therapy options

## 2023-08-24 NOTE — Assessment & Plan Note (Signed)
Will send cyclobenzaprine to facilitate sleep as noted. Encouraged patient to do gentle stretching/strengthening exercises. Referred to ortho as noted.

## 2023-08-24 NOTE — Assessment & Plan Note (Addendum)
Tremor with history of alcohol dependence. Suspect sequelae of long-term poor nutrition. Will check vitamin levels as noted under alcohol dependence. Encouraged patient to take a regular multivitamin beginning as soon as financially feasible. Will send prescriptions for any resultant deficiencies.

## 2023-08-24 NOTE — Assessment & Plan Note (Signed)
Denies current use. Did not take recommended supplements after hospitalization; will check levels today.

## 2023-08-26 ENCOUNTER — Telehealth: Payer: Self-pay | Admitting: Family Medicine

## 2023-08-26 NOTE — Telephone Encounter (Signed)
Per pt [facility] is not in network and neither is [facility] Referral sent to [facility]

## 2023-08-26 NOTE — Telephone Encounter (Signed)
Patient stopped in the office to let us know he went to his appt at [facility] today but they dont take his insurance and they requested him to pay $250.00 so he left.  He wants [place] to schedule him somewhere that accept his insurance that's within the Bus route locally in [place].   Please call patient with info.

## 2023-08-26 NOTE — Telephone Encounter (Signed)
Left detailed vm °

## 2023-08-28 ENCOUNTER — Telehealth: Payer: Self-pay

## 2023-08-28 NOTE — Telephone Encounter (Signed)
 Copied from CRM [id number]. Topic: Referral - Question >> Aug 26, 2023 11:04 AM Everette C wrote: Reason for CRM: The patient has been in contact with [facility] and been notified that their insurance is not accepted   The patient would like to be contacted by a member of staff to discuss potential options for providers that accept [employer]   Please contact the patient further when available

## 2023-08-29 LAB — COMPREHENSIVE METABOLIC PANEL
ALT: 12 [IU]/L (ref 0–44)
AST: 16 [IU]/L (ref 0–40)
Albumin: 5 g/dL — ABNORMAL HIGH (ref 3.8–4.9)
Alkaline Phosphatase: 89 [IU]/L (ref 44–121)
BUN/Creatinine Ratio: 15 (ref 9–20)
BUN: 13 mg/dL (ref 6–24)
Bilirubin Total: 0.5 mg/dL (ref 0.0–1.2)
CO2: 22 mmol/L (ref 20–29)
Calcium: 9.8 mg/dL (ref 8.7–10.2)
Chloride: 101 mmol/L (ref 96–106)
Creatinine, Ser: 0.89 mg/dL (ref 0.76–1.27)
Globulin, Total: 2.6 g/dL (ref 1.5–4.5)
Glucose: 102 mg/dL — ABNORMAL HIGH (ref 70–99)
Potassium: 4.8 mmol/L (ref 3.5–5.2)
Sodium: 139 mmol/L (ref 134–144)
Total Protein: 7.6 g/dL (ref 6.0–8.5)
eGFR: 99 mL/min/{1.73_m2} (ref >59)

## 2023-08-29 LAB — ANA 12PLUS PROFILE, DO ALL RDL
Anti-CCP Ab, IgG & IgA (RDL): <20 U (ref <20)
Anti-Centromere Ab (RDL): <1:40 {titer}
Anti-Chromatin Ab, IgG (RDL): <20 U (ref <20)
Anti-La (SS-B) Ab (RDL): <20 U (ref <20)
Anti-Nuclear Ab by IFA (RDL): POSITIVE — AB
Anti-Ro (SS-A) Ab (RDL): 99 U — ABNORMAL HIGH (ref <20)
Anti-Scl-70 Ab (RDL): <20 U (ref <20)
Anti-Sm Ab (RDL): <20 U (ref <20)
Anti-TPO Ab (RDL): 12.7 [IU]/mL — ABNORMAL HIGH (ref <9.0)
Anti-U1 RNP Ab (RDL): <20 U (ref <20)
C3 Complement (RDL): 182 mg/dL — ABNORMAL HIGH (ref 90–180)
C4 Complement (RDL): 31 mg/dL (ref 14–44)

## 2023-08-29 LAB — LIPID PANEL
Chol/HDL Ratio: 5 {ratio} (ref 0.0–5.0)
Cholesterol, Total: 170 mg/dL (ref 100–199)
HDL: 34 mg/dL — ABNORMAL LOW (ref >39)
LDL Chol Calc (NIH): 115 mg/dL — ABNORMAL HIGH (ref 0–99)
Triglycerides: 112 mg/dL (ref 0–149)
VLDL Cholesterol Cal: 21 mg/dL (ref 5–40)

## 2023-08-29 LAB — ANA TITER AND PATTERN: Speckled Pattern: 1:80 {titer} — ABNORMAL HIGH

## 2023-08-29 LAB — RHEUMATOID FACTOR: Rheumatoid fact SerPl-aCnc: <10 [IU]/mL (ref <14.0)

## 2023-08-29 LAB — SEDIMENTATION RATE: Sed Rate: 7 mm/h (ref 0–30)

## 2023-08-29 LAB — VITAMIN D 25 HYDROXY (VIT D DEFICIENCY, FRACTURES): Vit D, 25-Hydroxy: 13.9 ng/mL — ABNORMAL LOW (ref 30.0–100.0)

## 2023-09-02 NOTE — Telephone Encounter (Signed)
I spoke to patient and confirmed [facility] will accept his insurance. He has spoke with them as well and scheduled his appointment.

## 2023-09-10 ENCOUNTER — Ambulatory Visit: Payer: Self-pay | Admitting: Family Medicine

## 2023-09-10 ENCOUNTER — Telehealth: Payer: Self-pay

## 2023-09-10 NOTE — Telephone Encounter (Signed)
 Copied from CRM [id number]. Topic: Appointment Scheduling - Scheduling Inquiry for Clinic >> Sep 09, 2023  4:30 PM Rosaria BRAVO wrote: Reason for CRM: Pt wants to know what PCP advises about rescheduling. States he cannot afford the copay now and wants to know if she will allow him to see her after his ortho appt on 02/13.   Best contact: [phone]

## 2023-09-13 ENCOUNTER — Encounter: Payer: Self-pay | Admitting: Family Medicine

## 2023-09-13 DIAGNOSIS — G8929 Other chronic pain: Secondary | ICD-10-CM

## 2023-09-18 ENCOUNTER — Other Ambulatory Visit: Payer: Self-pay | Admitting: Family Medicine

## 2023-09-18 DIAGNOSIS — F1021 Alcohol dependence, in remission: Secondary | ICD-10-CM

## 2023-09-18 MED ORDER — FOLIC ACID 1 MG PO TABS
1.0000 mg | ORAL_TABLET | Freq: Every day | ORAL | 2 refills | Status: DC
Start: 2023-09-18 — End: 2023-11-04

## 2023-09-19 ENCOUNTER — Ambulatory Visit: Payer: No Typology Code available for payment source

## 2023-10-02 ENCOUNTER — Ambulatory Visit
Admission: RE | Admit: 2023-10-02 | Discharge: 2023-10-02 | Disposition: A | Discharge status: Home / Self Care | Payer: No Typology Code available for payment source | Source: Ambulatory Visit | Attending: Orthopedic Surgery

## 2023-10-02 ENCOUNTER — Ambulatory Visit
Admission: RE | Admit: 2023-10-02 | Discharge: 2023-10-02 | Disposition: A | Discharge status: Home / Self Care | Payer: No Typology Code available for payment source | Source: Ambulatory Visit | Attending: Orthopedic Surgery | Admitting: Orthopedic Surgery

## 2023-10-02 DIAGNOSIS — G8929 Other chronic pain: Secondary | ICD-10-CM | POA: Diagnosis present

## 2023-10-02 DIAGNOSIS — M549 Dorsalgia, unspecified: Secondary | ICD-10-CM | POA: Insufficient documentation

## 2023-10-03 ENCOUNTER — Ambulatory Visit: Payer: No Typology Code available for payment source | Admitting: Orthopedic Surgery

## 2023-10-03 ENCOUNTER — Encounter: Payer: Self-pay | Admitting: Orthopedic Surgery

## 2023-10-03 VITALS — BP 156/104 | HR 83 | Ht 68.0 in | Wt 158.0 lb

## 2023-10-03 DIAGNOSIS — G8929 Other chronic pain: Secondary | ICD-10-CM | POA: Diagnosis not present

## 2023-10-03 DIAGNOSIS — M542 Cervicalgia: Secondary | ICD-10-CM | POA: Diagnosis not present

## 2023-10-03 DIAGNOSIS — M545 Low back pain, unspecified: Secondary | ICD-10-CM | POA: Diagnosis not present

## 2023-10-03 NOTE — Progress Notes (Signed)
 New Patient Visit  Assessment: Lee Mueller is a 58 y.o. male with the following: 1. Chronic bilateral low back pain without sciatica 2. Neck pain  Plan: Lee Mueller is complaining of a lot of pain, and bilateral upper extremities, as well as bilateral lower extremities.  He has restricted motion of his neck.  He notes difficulty with balance, changes in his handwriting and difficulty with buttons.  MRI demonstrates multiple levels of degenerative changes of the facets.  This could be contributing to his symptoms overall.  He is also having problems in his lower back, which is affecting his function.  Given the constellation of symptoms, I would like him to be evaluated by Dr. Marcell Barlow.  We will place referral.  He will return to clinic as needed.  Follow-up: Return for Referral to Dr. Marcell Barlow.  Subjective:  Chief Complaint  Patient presents with   Back Pain    Larey Seat August and suffered back pains  trouble getting dressed and getting out of bed  having problems with his fingers legs and arms     History of Present Illness: Lee Mueller is a 58 y.o. male who has been referred by Jacquenette Shone, DO for evaluation of low back pain.  He also states he has a lot of neck pain, with radiating pains into his arms.  He has aching pains to her bilateral upper and lower extremities.  He has issues with his balance.  He has difficulty with buttons.  Medications have not been effective.  This is gotten a lot worse since he was admitted to the hospital approximately 10 months ago.   Review of Systems: No fevers or chills No numbness or tingling No chest pain No shortness of breath No bowel or bladder dysfunction No GI distress No headaches   Medical History:  Past Medical History:  Diagnosis Date   Alcohol use     No past surgical history on file.  Family History  Problem Relation Age of Onset   Breast cancer Mother    Social History   Tobacco Use   Smoking status: Every  Day    Current packs/day: 0.50    Types: Cigarettes   Smokeless tobacco: Never  Vaping Use   Vaping status: Never Used  Substance Use Topics   Alcohol use: Not Currently    Comment: 3 months sober   Drug use: Never    No Known Allergies  Current Meds  Medication Sig   amLODipine (NORVASC) 5 MG tablet Take 1 tablet (5 mg total) by mouth daily.   ascorbic acid (VITAMIN C) 250 MG tablet Take 1 tablet (250 mg total) by mouth daily.   cholecalciferol (VITAMIN D3) 25 MCG (1000 UNIT) tablet Take 1 tablet (1,000 Units total) by mouth daily.   cyanocobalamin 1000 MCG tablet Take 1 tablet (1,000 mcg total) by mouth daily.   cyclobenzaprine (FLEXERIL) 5 MG tablet Take 1-2 tablets (5-10 mg total) by mouth at bedtime as needed for muscle spasms.   feeding supplement (ENSURE ENLIVE / ENSURE PLUS) LIQD Take 237 mLs by mouth 2 (two) times daily between meals.   folic acid (FOLVITE) 1 MG tablet Take 1 tablet (1 mg total) by mouth daily.   meloxicam (MOBIC) 15 MG tablet Take 1 tablet (15 mg total) by mouth daily.   Multiple Vitamin (MULTIVITAMIN WITH MINERALS) TABS tablet Take 1 tablet by mouth daily.   polyethylene glycol (MIRALAX / GLYCOLAX) 17 g packet Take 17 g by mouth daily as needed for moderate constipation.  rosuvastatin (CRESTOR) 5 MG tablet Take 1 tablet (5 mg total) by mouth daily.   thiamine (VITAMIN B1) 100 MG tablet Take 1 tablet (100 mg total) by mouth daily.   Vitamin D, Ergocalciferol, (DRISDOL) 1.25 MG (50000 UNIT) CAPS capsule Take 1 capsule (50,000 Units total) by mouth every 7 (seven) days.    Objective: BP (!) 156/104   Pulse 83   Ht 5\' 8"  (1.727 m)   Wt 158 lb (71.7 kg)   BMI 24.02 kg/m   Physical Exam:  General: Alert and oriented. and No acute distress. Gait: Slow, steady gait.  Restricted motion of his neck.  Sensation intact throughout bilateral upper extremities.  Good strength.  Trace Hoffmann's bilaterally.  Tenderness to the lower back.  Negative straight  leg raise bilaterally.  He has some weakness in the right leg compared to the left.  IMAGING: I personally ordered and reviewed the following images   X-rays of the lumbar spine were obtained.  No anterolisthesis.  Evidence of degenerative changes.   New Medications:  No orders of the defined types were placed in this encounter.     Oliver Barre, MD  10/03/2023 11:07 AM

## 2023-10-14 NOTE — Progress Notes (Unsigned)
 Referring Physician:  Oliver Barre, MD [phone] [address] [place],  [place] [address]  Primary Physician:  Sherlyn Hay, DO  History of Present Illness: 10/16/2023 Lee Mueller has a history of HTN, tremors of both hands, history of ETOH dependence. 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  He has constant neck pain with limited ROM x years. No radicular arm pain, but he has bilateral shoulder pain. He also has some pain in his wrists and hands. He is having difficulty with his balance and dexterity issues with his hands. He is dropping things. He has numbness and tingling in hands that is worse at night. He notes his hands "lock up."  He has constant LBP with no leg pain, but he has constant numbness/tingling and weakness in both legs from knees into his feet. No pain in his thighs. No alleviating or aggravating factors.   Symptoms have been worse since he had imaging done in April 2024.   He is taking flexeril and mobic.   He smokes 1/2 PPD x 40 years.   Bowel/Bladder Dysfunction: none  Conservative measures:  Physical therapy: patient has not participated in PT for back or neck Multimodal medical therapy including regular antiinflammatories:  flexeril, meloxicam Injections:  has not had epidural steroid injections  Past Surgery: no past surgical history  Lee Mueller has issues with hand dexterity and balance issues.   The symptoms are causing a significant impact on the patient's life.   Review of Systems:  A 10 point review of systems is negative, except for the pertinent positives and negatives detailed in the HPI.  Past Medical History: Past Medical History:  Diagnosis Date   Alcohol use     Past Surgical History: History reviewed. No pertinent surgical history.  Allergies: Allergies as of 10/16/2023   (No Known Allergies)    Medications: Outpatient Encounter Medications as of 10/16/2023  Medication Sig   amLODipine (NORVASC) 5 MG tablet Take 1 tablet (5 mg total) by  mouth daily.   ascorbic acid (VITAMIN C) 250 MG tablet Take 1 tablet (250 mg total) by mouth daily.   cholecalciferol (VITAMIN D3) 25 MCG (1000 UNIT) tablet Take 1 tablet (1,000 Units total) by mouth daily.   cyanocobalamin 1000 MCG tablet Take 1 tablet (1,000 mcg total) by mouth daily.   cyclobenzaprine (FLEXERIL) 5 MG tablet Take 1-2 tablets (5-10 mg total) by mouth at bedtime as needed for muscle spasms.   feeding supplement (ENSURE ENLIVE / ENSURE PLUS) LIQD Take 237 mLs by mouth 2 (two) times daily between meals.   folic acid (FOLVITE) 1 MG tablet Take 1 tablet (1 mg total) by mouth daily.   meloxicam (MOBIC) 15 MG tablet Take 1 tablet (15 mg total) by mouth daily.   Multiple Vitamin (MULTIVITAMIN WITH MINERALS) TABS tablet Take 1 tablet by mouth daily.   polyethylene glycol (MIRALAX / GLYCOLAX) 17 g packet Take 17 g by mouth daily as needed for moderate constipation.   rosuvastatin (CRESTOR) 5 MG tablet Take 1 tablet (5 mg total) by mouth daily.   thiamine (VITAMIN B1) 100 MG tablet Take 1 tablet (100 mg total) by mouth daily.   Vitamin D, Ergocalciferol, (DRISDOL) 1.25 MG (50000 UNIT) CAPS capsule Take 1 capsule (50,000 Units total) by mouth every 7 (seven) days.   No facility-administered encounter medications on file as of 10/16/2023.    Social History: Social History   Tobacco Use   Smoking status: Every Day    Current packs/day: 0.50  Types: Cigarettes   Smokeless tobacco: Never  Vaping Use   Vaping status: Never Used  Substance Use Topics   Alcohol use: Not Currently    Comment: 3 months sober   Drug use: Never    Family Medical History: Family History  Problem Relation Age of Onset   Breast cancer Mother     Physical Examination: Vitals:   10/16/23 1351  BP: 136/78    General: Patient is well developed, well nourished, calm, collected, and in no apparent distress. Attention to examination is appropriate.  Respiratory: Patient is breathing without any  difficulty.   NEUROLOGICAL:     Awake, alert, oriented to person, place, and time.  Speech is clear and fluent. Fund of knowledge is appropriate.   Cranial Nerves: Pupils equal round and reactive to light.  Facial tone is symmetric.    Mild lower posterior cervical tenderness. Mild tenderness in bilateral trapezial region.   No thoracic tenderness.   Diffuse lower lumbar tenderness.   No abnormal lesions on exposed skin.   Strength: Side Biceps Triceps Deltoid Interossei Grip Wrist Ext. Wrist Flex.  R 5 5 5 5 5 5 5   L 5 5 5 5 5 5 5    Side Iliopsoas Quads Hamstring PF DF EHL  R 5 5 5 5 5 5   L 5 5 5 5 5 5    Reflexes are 3+ and symmetric at the biceps, brachioradialis, patella and achilles.   Hoffman's is absent.  Clonus is not present.   Bilateral upper and lower extremity sensation is intact to light touch, but diminished from knees down bilaterally.   He has slow unsteady gait.   No pain with IR/ER of hips.   He has limited painful ROM of shoulders, can only get to 100 degrees of abduction/forward flexion.   Negative tinels at wrist bilaterally. Positive phalens at wrist bilaterally.   Gait is normal.     Medical Decision Making  Imaging: Cervical MRI dated 11/08/22:  MRI CERVICAL SPINE FINDINGS   Alignment: Straightening of the normal cervical lordosis.   Vertebrae: No fracture, evidence of discitis, or bone lesion.   Cord: Normal signal and morphology.   Posterior Fossa, vertebral arteries, paraspinal tissues: Negative.   Disc levels:   C1-C2: Mild degenerative change.   C2-C3: Moderate left and mild right facet degenerative change. Uncovertebral hypertrophy. Mild-to-moderate left neural foraminal narrowing. No spinal canal narrowing.   C3-C4: Moderate bilateral facet degenerative change. Uncovertebral hypertrophy. Moderate bilateral neural foraminal narrowing. Mild spinal canal narrowing.   C4-C5: Moderate bilateral facet degenerative change.  Uncovertebral hypertrophy. Minimal disc bulge. Mild spinal canal narrowing. Mild bilateral neural foraminal narrowing.   C5-C6: Moderate bilateral facet degenerative change. Uncovertebral hypertrophy. Moderate right and mild left neural foraminal narrowing. Mild spinal canal narrowing.   C6-C7: Mild bilateral facet degenerative change. Uncovertebral hypertrophy. No spinal canal narrowing. No significant neural foraminal narrowing.   C7-T1: Unremarkable.   IMPRESSION: 1. No acute intracranial process. No intracranial contrast enhancing lesion or abnormal contrast enhanacement in the cervical spine. 2. Multilevel cervical spine degenerative changes with up to moderate bilateral neural foraminal narrowing at C3-C4 and on the right at C5-C6. 3. Mild spinal canal stenosis at C3-C4, C4-C5, and C5-C6.     Electronically Signed   By: Lorenza Cambridge M.D.   On: 11/08/2022 21:30   MRI of lumbar spine dated 11/10/22:  FINDINGS: Segmentation:  5 lumbar type vertebral bodies.   Alignment: No listhesis. Straightening of the normal lumbar lordosis.  Vertebrae: No acute fracture, suspicious osseous lesion, or evidence of discitis.   Conus medullaris and cauda equina: Conus extends to the L1-L2 level. Conus and cauda equina appear normal.   Paraspinal and other soft tissues: Right parapelvic cyst, for which no follow-up is currently indicated. No lymphadenopathy.   Disc levels:   T12-L1: No significant disc bulge. Mild facet arthropathy. No spinal canal stenosis or neural foraminal narrowing. Disc desiccation and minimal disc bulge. Mild facet arthropathy. No spinal canal stenosis or neural foraminal narrowing.   L1-L2: Minimal disc bulge. Mild-to-moderate facet arthropathy. Mild spinal canal stenosis. No neural foraminal narrowing.   L2-L3: No significant disc bulge. Mild facet arthropathy. No spinal canal stenosis or neural foraminal narrowing.   L3-L4: Mild disc bulge. Mild  facet arthropathy. No spinal canal stenosis or neural foraminal narrowing.   L4-L5: Minimal disc bulge with right extreme lateral protrusion. Mild facet arthropathy. No spinal canal stenosis. Mild bilateral neural foraminal narrowing.   L5-S1: Disc desiccation and minimal disc bulge. No spinal canal stenosis. Mild right neural foraminal narrowing.   IMPRESSION: 1. L1-L2 mild spinal canal stenosis. No neural foraminal narrowing. 2. L4-L5 mild bilateral neural foraminal narrowing. 3. L5-S1 mild right neural foraminal narrowing. 4. Multilevel facet arthropathy, which can be a cause of back pain.     Electronically Signed   By: Wiliam Ke M.D.   On: 11/11/2022 02:28    Lumbar xrays dated 10/02/23:  FINDINGS: Five lumbar type vertebra. There is no acute fracture or subluxation of the lumbar spine. The bones are osteopenic. Multilevel degenerative changes and facet arthropathy. Atherosclerotic calcification of the abdominal aorta. The soft tissues are unremarkable.   IMPRESSION: 1. No acute findings. 2. Multilevel degenerative changes.     Electronically Signed   By: Elgie Collard M.D.   On: 10/15/2023 14:22  I have personally reviewed the images and agree with the above interpretation.  Assessment and Plan: Mr. Renner has constant neck pain with limited ROM x years. No radicular arm pain, but he has bilateral shoulder pain. He is having difficulty with his balance and dexterity issues with his hands. He is dropping things. He has numbness and tingling in hands that is worse at night. He notes his hands "lock up."  Cervical MRI from last April did not show any significant central spinal stenosis. He has cervical spondylosis with multilevel foraminal stenosis.   Numbness/tingling in hands is suspicious for carpal tunnel.   He also has constant LBP with no leg pain, but he has constant numbness/tingling and weakness in both legs from knees into his feet. No pain in his  thighs.   Lumbar MRI from last April shows right lateral disc and mild bilateral foraminal stenosis at L4-L5 and mild right foraminal stenosis L5-S1. LBP likely due to spondylosis, this would not explain numbness/tingling in legs from knees down.   Treatment options discussed with patient and following plan made:   - MRI of cervical and thoracic spine to evaluate balance and dexterity issues. Symptoms worse since last imaging in April 2024.  - EMG of bilateral upper and lower extremities ordered with [facility].  - He has bilateral shoulder pain with painful/limited ROM. Referred back to ortho (Dr. Dallas Schimke) for this.  - He is 180 days [facility] and [facility]. He will let me know if he has transportation issues.  - Will schedule follow up phone visit to review MRI results once I have them back.   I spent a total of  45 minutes in face-to-face and non-face-to-face activities related to this patient's care today including review of outside records, review of imaging, review of symptoms, physical exam, discussion of differential diagnosis, discussion of treatment options, and documentation.   Thank you for involving me in the care of this patient.   Drake Leach PA-C Dept. of Neurosurgery

## 2023-10-16 ENCOUNTER — Encounter: Payer: Self-pay | Admitting: Orthopedic Surgery

## 2023-10-16 ENCOUNTER — Ambulatory Visit (INDEPENDENT_AMBULATORY_CARE_PROVIDER_SITE_OTHER): Admitting: Orthopedic Surgery

## 2023-10-16 VITALS — BP 136/78 | Ht 68.0 in | Wt 158.0 lb

## 2023-10-16 DIAGNOSIS — R2689 Other abnormalities of gait and mobility: Secondary | ICD-10-CM | POA: Diagnosis not present

## 2023-10-16 DIAGNOSIS — M48061 Spinal stenosis, lumbar region without neurogenic claudication: Secondary | ICD-10-CM | POA: Diagnosis not present

## 2023-10-16 DIAGNOSIS — R278 Other lack of coordination: Secondary | ICD-10-CM

## 2023-10-16 DIAGNOSIS — M47812 Spondylosis without myelopathy or radiculopathy, cervical region: Secondary | ICD-10-CM

## 2023-10-16 DIAGNOSIS — M47816 Spondylosis without myelopathy or radiculopathy, lumbar region: Secondary | ICD-10-CM

## 2023-10-16 DIAGNOSIS — M25512 Pain in left shoulder: Secondary | ICD-10-CM

## 2023-10-16 DIAGNOSIS — R200 Anesthesia of skin: Secondary | ICD-10-CM

## 2023-10-16 DIAGNOSIS — M542 Cervicalgia: Secondary | ICD-10-CM

## 2023-10-16 DIAGNOSIS — M25511 Pain in right shoulder: Secondary | ICD-10-CM

## 2023-10-16 NOTE — Patient Instructions (Signed)
 It was so nice to see you today. Thank you so much for coming in.    I want to get an MRI of your neck and mid back to look into things further. We will get this approved through your insurance and [facility] will call you to schedule the appointment.   [facility] (building with the white pillars) is located off of [address]. The address is [address] [place], [place] [address].    After you have the MRI, it takes 14-21 days for me to get the results back. Once I have them, we will call you to schedule a follow up phone visit with me to review them.   I want you to see ortho (Dr. Dallas Schimke) for your shoulders. They should call you. See his contact information below.   I want to get an EMG (nerve conduction test) of both your arms and legs to look into things further. I have ordered this and [facility] will call you to schedule. You can also call them at [phone].   Please do not hesitate to call if you have any questions or concerns. You can also message me in MyChart.   Drake Leach PA-C [phone]     The physicians and staff at [facility] Neurosurgery at [facility] are committed to providing excellent care. You may receive a survey asking for feedback about your experience at our office. We value you your feedback and appreciate you taking the time to to fill it out. The [facility] leadership team is also available to discuss your experience in person, feel free to contact us [phone].

## 2023-10-25 ENCOUNTER — Ambulatory Visit
Admission: RE | Admit: 2023-10-25 | Discharge: 2023-10-25 | Disposition: A | Discharge status: Home / Self Care | Source: Ambulatory Visit | Attending: Orthopedic Surgery | Admitting: Orthopedic Surgery

## 2023-10-25 ENCOUNTER — Other Ambulatory Visit: Payer: Self-pay

## 2023-10-25 DIAGNOSIS — R2689 Other abnormalities of gait and mobility: Secondary | ICD-10-CM | POA: Diagnosis present

## 2023-10-25 DIAGNOSIS — R278 Other lack of coordination: Secondary | ICD-10-CM | POA: Insufficient documentation

## 2023-10-25 DIAGNOSIS — M47812 Spondylosis without myelopathy or radiculopathy, cervical region: Secondary | ICD-10-CM | POA: Diagnosis present

## 2023-10-25 DIAGNOSIS — R531 Weakness: Secondary | ICD-10-CM

## 2023-10-25 DIAGNOSIS — M542 Cervicalgia: Secondary | ICD-10-CM

## 2023-10-25 DIAGNOSIS — R251 Tremor, unspecified: Secondary | ICD-10-CM

## 2023-10-25 DIAGNOSIS — M79605 Pain in left leg: Secondary | ICD-10-CM

## 2023-10-30 ENCOUNTER — Other Ambulatory Visit: Payer: Self-pay

## 2023-10-30 DIAGNOSIS — R202 Paresthesia of skin: Secondary | ICD-10-CM

## 2023-11-01 ENCOUNTER — Encounter: Admitting: Neurology

## 2023-11-01 NOTE — Progress Notes (Addendum)
 Referring Physician:  Sherlyn Hay, DO [address] [place],  [place] [address]  Primary Physician:  Sherlyn Hay, DO  History of Present Illness: 11/04/2023 [name] has a history of HTN, tremors of both hands, history of ETOH dependence. 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  Last seen by me on 10/16/23 for neck pain, balance/dexterity issues, and constant LBP.   Cervical MRI from last April did not show any significant central spinal stenosis. He has cervical spondylosis with multilevel foraminal stenosis.    Numbness/tingling in hands was suspicious for carpal tunnel.    Lumbar MRI from last April showed right lateral disc and mild bilateral foraminal stenosis at L4-L5 and mild right foraminal stenosis L5-S1. LBP likely due to spondylosis, this would not explain numbness/tingling in legs from knees down.   He was referred back to ortho for his bilateral shoulder pain and limited ROM. He has appointment on 11/14/23.   EMG of bilateral upper and lower extremities was ordered. They are scheduled for 11/14/23 and 11/15/23.   He is here to review his cervical and thoracic MRI scans.   His symptoms are the same. He continues with constant neck pain with bilateral shoulder pain. No radicular pain in his arms, but he has pain in his wrists/hands. He continues with difficulty with his balance and dexterity issues with his hands. He is dropping things. He has numbness and tingling in hands that is worse at night. He notes his hands "lock up."  He has constant LBP with no leg pain, but he has constant numbness/tingling and weakness in both legs from knees into his feet. No pain in his thighs. No alleviating or aggravating factors.   He is taking flexeril and mobic.   He smokes 1/2 PPD x 40 years.   Bowel/Bladder Dysfunction: none  Conservative measures:  Physical therapy: patient has not participated in PT for back or neck Multimodal medical therapy including regular  antiinflammatories:  flexeril, meloxicam Injections:  has not had epidural steroid injections  Past Surgery: no past surgical history  Sina Lucchesi has issues with hand dexterity and balance issues.   The symptoms are causing a significant impact on the patient's life.   Review of Systems:  A 10 point review of systems is negative, except for the pertinent positives and negatives detailed in the HPI.  Past Medical History: Past Medical History:  Diagnosis Date   Alcohol use     Past Surgical History: No past surgical history on file.  Allergies: Allergies as of 11/04/2023   (No Known Allergies)    Medications: Outpatient Encounter Medications as of 11/04/2023  Medication Sig   amLODipine (NORVASC) 5 MG tablet Take 1 tablet (5 mg total) by mouth daily.   ascorbic acid (VITAMIN C) 250 MG tablet Take 1 tablet (250 mg total) by mouth daily.   cholecalciferol (VITAMIN D3) 25 MCG (1000 UNIT) tablet Take 1 tablet (1,000 Units total) by mouth daily.   cyanocobalamin 1000 MCG tablet Take 1 tablet (1,000 mcg total) by mouth daily.   cyclobenzaprine (FLEXERIL) 5 MG tablet Take 1-2 tablets (5-10 mg total) by mouth at bedtime as needed for muscle spasms.   feeding supplement (ENSURE ENLIVE / ENSURE PLUS) LIQD Take 237 mLs by mouth 2 (two) times daily between meals.   folic acid (FOLVITE) 1 MG tablet Take 1 tablet (1 mg total) by mouth daily.   meloxicam (MOBIC) 15 MG tablet Take 1 tablet (15 mg total) by mouth daily.  Multiple Vitamin (MULTIVITAMIN WITH MINERALS) TABS tablet Take 1 tablet by mouth daily.   polyethylene glycol (MIRALAX / GLYCOLAX) 17 g packet Take 17 g by mouth daily as needed for moderate constipation.   rosuvastatin (CRESTOR) 5 MG tablet Take 1 tablet (5 mg total) by mouth daily.   thiamine (VITAMIN B1) 100 MG tablet Take 1 tablet (100 mg total) by mouth daily.   Vitamin D, Ergocalciferol, (DRISDOL) 1.25 MG (50000 UNIT) CAPS capsule Take 1 capsule (50,000 Units total)  by mouth every 7 (seven) days.   No facility-administered encounter medications on file as of 11/04/2023.    Social History: Social History   Tobacco Use   Smoking status: Every Day    Current packs/day: 0.50    Types: Cigarettes   Smokeless tobacco: Never  Vaping Use   Vaping status: Never Used  Substance Use Topics   Alcohol use: Not Currently    Comment: 3 months sober   Drug use: Never    Family Medical History: Family History  Problem Relation Age of Onset   Breast cancer Mother     Physical Examination: Vitals:   11/04/23 0921 11/04/23 1011  BP: (!) 140/82 (!) 148/86    Awake, alert, oriented to person, place, and time.  Speech is clear and fluent. Fund of knowledge is appropriate.   Cranial Nerves: Pupils equal round and reactive to light.  Facial tone is symmetric.    No abnormal lesions on exposed skin.   Strength: Side Biceps Triceps Deltoid Interossei Grip Wrist Ext. Wrist Flex.  R 5 5 5 5 5 5 5   L 5 5 5 5 5 5 5    Side Iliopsoas Quads Hamstring PF DF EHL  R 5 5 5 5 5 5   L 5 5 5 5 5 5    He does not give good effort with strength testing in upper extremities, no gross weakness, but has diffuse give way weakness.   Reflexes are 3+ and symmetric at the biceps, brachioradialis, patella and achilles.   Hoffman's is absent.  Clonus is not present.   Bilateral upper and lower extremity sensation is intact to light touch.   He has slow unsteady gait.  Medical Decision Making  Imaging: Cervical MRI dated 10/25/23:  FINDINGS: Alignment: Straightening of the normal cervical lordosis. Underlying trace dextroscoliosis. No significant listhesis.   Vertebrae: Vertebral body height maintained without acute or chronic fracture. Bone marrow signal intensity within normal limits. Few small benign hemangiomata noted. No worrisome osseous lesions. Minimal reactive marrow edema present about the left C4-5 facet due to facet arthritis. No other abnormal marrow  edema.   Cord: Normal signal and morphology.   Posterior Fossa, vertebral arteries, paraspinal tissues: Unremarkable.   Disc levels:   C2-C3: Normal interspace. Mild left-sided facet spurring. No canal or foraminal stenosis.   C3-C4: Small central disc protrusion indents the ventral thecal sac (series 8, image 16). Mild bilateral facet hypertrophy with mild bilateral uncovertebral spurring. No significant spinal stenosis. Mild left C4 foraminal narrowing. Right neural foramen remains patent.   C4-C5: Small right paracentral disc protrusion indents the ventral thecal sac (series 9, image 21). Minimal cord flattening without cord signal changes or significant spinal stenosis. Superimposed bilateral uncovertebral spurring with left-sided facet degeneration. Mild left C5 foraminal narrowing. Right neural foramen remains patent.   C5-C6: Mild disc bulge with uncovertebral spurring, slightly asymmetric to the right. Mild left-sided facet hypertrophy. No spinal stenosis. Mild right C6 foraminal narrowing. Left neural foramina remains patent.  C6-C7: Minimal disc bulge with uncovertebral spurring. No canal or foraminal stenosis.   C7-T1: Normal interspace. Mild left-sided facet hypertrophy. No canal or foraminal stenosis.   IMPRESSION: 1. Small central disc protrusion at C3-4 without stenosis. 2. Small right paracentral disc protrusion at C4-5 with minimal cord flattening, but no cord signal changes or significant stenosis. 3. Mild left C4 and C5 foraminal stenosis, with mild right C6 foraminal narrowing related to disc bulge and uncovertebral disease. 4. Mild multilevel facet hypertrophy as detailed above, most pronounced at C4-5. Findings could contribute to neck pain.     Electronically Signed   By: Rise Mu M.D.   On: 10/27/2023 23:50   Thoracic MRI scan dated 10/25/23:  FINDINGS: Alignment: Vertebral bodies normally aligned with preservation of the  normal thoracic kyphosis. No listhesis.   Vertebrae: Vertebral body height maintained without acute or chronic fracture. Bone marrow signal intensity mildly heterogeneous but overall within normal limits. Few scattered benign hemangiomata noted. No worrisome osseous lesions. No abnormal marrow edema.   Cord:  Normal signal and morphology.   Paraspinal and other soft tissues: Paraspinous soft tissues within normal limits. T2 hyperintense simple right renal parapelvic cyst partially visualize, benign in appearance, no follow-up imaging recommended.   Disc levels:   T1-2:  Unremarkable.   T2-3: Unremarkable.   T3-4: Disc desiccation without disc bulge. No canal or foraminal stenosis.   T4-5: Minimal endplate spurring without significant disc bulge. Mild right-sided facet hypertrophy. No stenosis.   T5-6: Disc desiccation. Tiny right paracentral disc protrusion with annular fissure minimally flattens the ventral thecal sac (series 20, image 17). Minimal right-sided facet hypertrophy. No spinal stenosis. Foramina remain patent.   T6-7: Small right paracentral disc protrusion with annular fissure minimally indents the ventral thecal sac (series 20, image 20). No significant spinal stenosis. Foramina remain patent.   T7-8: Chronic endplate Schmorl's node deformity at the superior endplate of T8 without disc bulge. No stenosis.   T8-9: Disc desiccation. Small central to right paracentral disc protrusion with annular fissure minimally indents the ventral thecal sac (series 20, image 26). No spinal stenosis. Foramina remain patent.   T9-10: Unremarkable.   T10-11: Small chronic endplate Schmorl's node deformities without significant disc bulge. No stenosis.   T11-12: Normal interspace. Mild left-sided facet hypertrophy. No canal or foraminal stenosis.   T12-L1: Normal interspace. Mild left-sided facet hypertrophy. No canal or foraminal stenosis.   IMPRESSION: 1. Small  right paracentral disc protrusions at T5-6, T6-7, and T8-9 without significant stenosis or neural impingement. 2. Otherwise minor for age spondylosis elsewhere within the thoracic spine as above. No other significant stenosis or neural impingement. 3. Normal MRI appearance of the thoracic spinal cord.     Electronically Signed   By: Rise Mu M.D.   On: 10/27/2023 23:59  I have personally reviewed the images and agree with the above interpretation.  Assessment and Plan: Mr. Michaelsen has constant neck pain with bilateral shoulder pain. No radicular pain in his arms, but he has pain in his wrists/hands. He continues with difficulty with his balance and dexterity issues with his hands. He is dropping things. He has numbness and tingling in hands that is worse at night.   Cervical MRI with spondylosis and mild multilevel foraminal stenosis. No central stenosis.   Thoracic MRI with no compression noted.   He has constant LBP with no leg pain, but he has constant numbness/tingling and weakness in both legs from knees into his feet. No pain in his  thighs.   Lumbar MRI from last April shows right lateral disc and mild bilateral foraminal stenosis at L4-L5 and mild right foraminal stenosis L5-S1. LBP likely due to spondylosis, this would not explain numbness/tingling in legs from knees down.   Treatment options discussed with patient and following plan made:   - Keep follow up with ortho for shoulder pain.  - Keep scheduled appointment with neurology for bilateral upper and lower extremity EMGs.  - He is over 6 months sober and [facility]. He will let me know if he has transportation issues.  - He has applied for disability- he will contact them to find out how they can get his records.  - Will schedule follow up phone visit to review EMG results once I have them back.   BP was slightly elevated. No symptoms of chest pain, shortness of breath, blurry vision, or headaches. He  will recheck at home and call PCP if not improved. If he develops CP, SOB, blurry vision, or headaches, then he will go to ED.     I spent a total of 20 minutes in face-to-face and non-face-to-face activities related to this patient's care today including review of outside records, review of imaging, review of symptoms, physical exam, discussion of differential diagnosis, discussion of treatment options, and documentation.   Drake Leach PA-C Dept. of Neurosurgery

## 2023-11-04 ENCOUNTER — Ambulatory Visit: Payer: No Typology Code available for payment source | Admitting: Family Medicine

## 2023-11-04 ENCOUNTER — Encounter: Payer: Self-pay | Admitting: Orthopedic Surgery

## 2023-11-04 ENCOUNTER — Encounter: Payer: Self-pay | Admitting: Family Medicine

## 2023-11-04 ENCOUNTER — Ambulatory Visit: Admitting: Orthopedic Surgery

## 2023-11-04 VITALS — BP 151/88 | HR 75 | Ht 67.0 in | Wt 146.4 lb

## 2023-11-04 VITALS — BP 148/86 | Ht 68.0 in | Wt 158.0 lb

## 2023-11-04 DIAGNOSIS — M4802 Spinal stenosis, cervical region: Secondary | ICD-10-CM

## 2023-11-04 DIAGNOSIS — M47812 Spondylosis without myelopathy or radiculopathy, cervical region: Secondary | ICD-10-CM

## 2023-11-04 DIAGNOSIS — Z122 Encounter for screening for malignant neoplasm of respiratory organs: Secondary | ICD-10-CM

## 2023-11-04 DIAGNOSIS — R0609 Other forms of dyspnea: Secondary | ICD-10-CM | POA: Diagnosis not present

## 2023-11-04 DIAGNOSIS — M2550 Pain in unspecified joint: Secondary | ICD-10-CM

## 2023-11-04 DIAGNOSIS — M47816 Spondylosis without myelopathy or radiculopathy, lumbar region: Secondary | ICD-10-CM | POA: Diagnosis not present

## 2023-11-04 DIAGNOSIS — R2689 Other abnormalities of gait and mobility: Secondary | ICD-10-CM

## 2023-11-04 DIAGNOSIS — R278 Other lack of coordination: Secondary | ICD-10-CM

## 2023-11-04 DIAGNOSIS — Z09 Encounter for follow-up examination after completed treatment for conditions other than malignant neoplasm: Secondary | ICD-10-CM | POA: Insufficient documentation

## 2023-11-04 DIAGNOSIS — Z23 Encounter for immunization: Secondary | ICD-10-CM

## 2023-11-04 DIAGNOSIS — M25512 Pain in left shoulder: Secondary | ICD-10-CM

## 2023-11-04 DIAGNOSIS — F1021 Alcohol dependence, in remission: Secondary | ICD-10-CM

## 2023-11-04 DIAGNOSIS — Z1211 Encounter for screening for malignant neoplasm of colon: Secondary | ICD-10-CM

## 2023-11-04 DIAGNOSIS — M25511 Pain in right shoulder: Secondary | ICD-10-CM

## 2023-11-04 DIAGNOSIS — M48061 Spinal stenosis, lumbar region without neurogenic claudication: Secondary | ICD-10-CM

## 2023-11-04 DIAGNOSIS — E782 Mixed hyperlipidemia: Secondary | ICD-10-CM

## 2023-11-04 DIAGNOSIS — H548 Legal blindness, as defined in USA: Secondary | ICD-10-CM

## 2023-11-04 DIAGNOSIS — I10 Essential (primary) hypertension: Secondary | ICD-10-CM

## 2023-11-04 DIAGNOSIS — F17200 Nicotine dependence, unspecified, uncomplicated: Secondary | ICD-10-CM

## 2023-11-04 DIAGNOSIS — R200 Anesthesia of skin: Secondary | ICD-10-CM

## 2023-11-04 MED ORDER — ALBUTEROL SULFATE HFA 108 (90 BASE) MCG/ACT IN AERS
2.0000 | INHALATION_SPRAY | Freq: Four times a day (QID) | RESPIRATORY_TRACT | 2 refills | Status: DC | PRN
Start: 2023-11-04 — End: 2023-12-24

## 2023-11-04 MED ORDER — ADULT MULTIVITAMIN W/MINERALS CH: 1.0000 | ORAL_TABLET | Freq: Every day | ORAL | Status: AC

## 2023-11-04 MED ORDER — CYANOCOBALAMIN 1000 MCG PO TABS: 1000.0000 ug | ORAL_TABLET | Freq: Every day | ORAL | 1 refills | Status: AC

## 2023-11-04 MED ORDER — FOLIC ACID 1 MG PO TABS: 1.0000 mg | ORAL_TABLET | Freq: Every day | ORAL | 2 refills | Status: AC

## 2023-11-04 MED ORDER — THIAMINE HCL 100 MG PO TABS: 100.0000 mg | ORAL_TABLET | Freq: Every day | ORAL | 1 refills | Status: AC

## 2023-11-04 MED ORDER — AMLODIPINE BESYLATE 10 MG PO TABS
10.0000 mg | ORAL_TABLET | Freq: Every day | ORAL | 1 refills | Status: DC
Start: 2023-11-04 — End: 2024-05-13

## 2023-11-04 NOTE — Patient Instructions (Signed)
 It was so nice to see you today. Thank you so much for coming in.    I don't see any pressure on your spinal cord in your neck or mid back that would explain your symptoms.   I want to see what the nerve tests show. Follow up with neurology and ortho as scheduled.   Call disability and find out what you need to do to get them all of your records.   Your blood pressure was slightly elevated today. I want you to recheck it at home and follow up with your PCP if it remains high. If you have any chest pain, shortness of breath, blurry vision, or headaches then you need to go to ED.    I will be in touch with you once I get the nerve test results. Will likely set up a phone visit to review.   Please do not hesitate to call if you have any questions or concerns. You can also message me in MyChart.   Drake Leach PA-C [phone]     The physicians and staff at [facility] at [facility] are committed to providing excellent care. You may receive a survey asking for feedback about your experience at our office. We value you your feedback and appreciate you taking the time to to fill it out. The [facility] leadership team is also available to discuss your experience in person, feel free to contact us [phone].

## 2023-11-04 NOTE — Patient Instructions (Addendum)
 Rheumatology  Pollyann Savoy, MD  [address], [place], [place] [address] Phone: [phone]

## 2023-11-04 NOTE — Progress Notes (Signed)
 Established patient visit   Patient: Lee Mueller   DOB: 1965-08-27   58 y.o. Male  MRN: [id number] Visit Date: 11/04/2023  Today's healthcare provider: Sherlyn Hay, DO   Chief Complaint  Patient presents with   Follow-up    Follow up from specialist   Subjective    HPI [name] is a 58 year old male who presents with joint pain and numbness in the upper body.  He is experiencing significant joint pain and numbness in his upper body, including his shoulders, neck, elbows, and wrists. This discomfort is affecting his sleep as he struggles to find a comfortable position. He has been referred for nerve conduction studies and has upcoming appointments with neurology and orthopedics to further investigate these symptoms.  He has a history of hypertension with fluctuating blood pressure readings. He occasionally checks his blood pressure at the pharmacy but acknowledges he should monitor it more regularly. He is currently taking amlodipine daily for blood pressure management. He experiences shortness of breath with prolonged walking, which he describes as 'wearing me down.' He notes that he needs to stop and take breaks during longer walks, which is a change from his previous capabilities.  He has a history of low vitamin D levels and is currently taking high-dose vitamin D once a week.   He has a long history of smoking, approximately 10 to 15 cigarettes a day, and has been smoking since he was about 58 years old. He has attempted to quit in the past but has resumed smoking each time.  He has been taking vitamins, including vitamin B1 and a multivitamin, to support his health after stopping alcohol consumption six and a half months ago. He reports a diet consisting mainly of soups, sandwiches, and tuna fish.  He is filing for disability due to his ongoing health issues and has been attending various medical appointments as part of this process.  He has a history of pneumonia,  having had it four times.  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      Medications: Outpatient Medications Prior to Visit  Medication Sig   cyclobenzaprine (FLEXERIL) 5 MG tablet Take 1-2 tablets (5-10 mg total) by mouth at bedtime as needed for muscle spasms.   feeding supplement (ENSURE ENLIVE / ENSURE PLUS) LIQD Take 237 mLs by mouth 2 (two) times daily between meals.   polyethylene glycol (MIRALAX / GLYCOLAX) 17 g packet Take 17 g by mouth daily as needed for moderate constipation.   rosuvastatin (CRESTOR) 5 MG tablet Take 1 tablet (5 mg total) by mouth daily.   Vitamin D, Ergocalciferol, (DRISDOL) 1.25 MG (50000 UNIT) CAPS capsule Take 1 capsule (50,000 Units total) by mouth every 7 (seven) days.   [DISCONTINUED] amLODipine (NORVASC) 5 MG tablet Take 1 tablet (5 mg total) by mouth daily.   [DISCONTINUED] ascorbic acid (VITAMIN C) 250 MG tablet Take 1 tablet (250 mg total) by mouth daily.   [DISCONTINUED] cholecalciferol (VITAMIN D3) 25 MCG (1000 UNIT) tablet Take 1 tablet (1,000 Units total) by mouth daily.   [DISCONTINUED] cyanocobalamin 1000 MCG tablet Take 1 tablet (1,000 mcg total) by mouth daily.   [DISCONTINUED] folic acid (FOLVITE) 1 MG tablet Take 1 tablet (1 mg total) by mouth daily.   [DISCONTINUED] meloxicam (MOBIC) 15 MG tablet Take 1 tablet (15 mg total) by mouth daily.   [DISCONTINUED] Multiple Vitamin (MULTIVITAMIN  WITH MINERALS) TABS tablet Take 1 tablet by mouth daily.   [DISCONTINUED] thiamine (VITAMIN B1) 100 MG tablet Take 1 tablet (100 mg total) by mouth daily.   No facility-administered medications prior to visit.    Review of Systems  Respiratory:  Positive for shortness of breath (on exertion). Negative for cough and wheezing.   Cardiovascular:  Negative for chest pain, palpitations and leg swelling.  Musculoskeletal:  Positive for arthralgias.  Neurological:  Positive  for numbness. Negative for weakness and headaches.        Objective    BP (!) 151/88 (BP Location: Right Arm, Patient Position: Sitting, Cuff Size: Normal)   Pulse 75   Ht 5\' 7"  (1.702 m)   Wt 146 lb 6.4 oz (66.4 kg)   SpO2 100%   BMI 22.93 kg/m     Physical Exam Vitals and nursing note reviewed.  Constitutional:      General: He is not in acute distress.    Appearance: Normal appearance.  HENT:     Head: Normocephalic and atraumatic.  Eyes:     General: No scleral icterus.    Conjunctiva/sclera: Conjunctivae normal.  Cardiovascular:     Rate and Rhythm: Normal rate.  Pulmonary:     Effort: Pulmonary effort is normal.  Neurological:     Mental Status: He is alert and oriented to person, place, and time. Mental status is at baseline.  Psychiatric:        Mood and Affect: Mood normal.        Behavior: Behavior normal.      No results found for any visits on 11/04/23.  Assessment & Plan    Multiple joint pain  Follow-up exam, less than 3 months since previous exam  Essential hypertension -     amLODIPine Besylate; Take 1 tablet (10 mg total) by mouth daily.  Dispense: 90 tablet; Refill: 1  Dyspnea on exertion -     Albuterol Sulfate HFA; Inhale 2 puffs into the lungs every 6 (six) hours as needed for wheezing or shortness of breath.  Dispense: 8 g; Refill: 2 -     Pulmonary Function Test; Future  Mixed hyperlipidemia  Need for shingles vaccine -     Varicella-zoster vaccine IM  Encounter for Prevnar pneumococcal vaccination -     Pneumococcal conjugate vaccine 20-valent  Encounter for screening for lung cancer -     Ambulatory Referral for Lung Cancer Scre  History of alcohol dependence (HCC) -     Cyanocobalamin; Take 1 tablet (1,000 mcg total) by mouth daily.  Dispense: 90 tablet; Refill: 1 -     multivitamin with minerals; Take 1 tablet by mouth daily. -     Folic Acid; Take 1 tablet (1 mg total) by mouth daily.  Dispense: 30 tablet; Refill: 2 -      Thiamine HCl; Take 1 tablet (100 mg total) by mouth daily.  Dispense: 30 tablet; Refill: 1  Encounter for colorectal cancer screening -     Cologuard  Nicotine dependence with current use -     Pulmonary Function Test; Future  Legally blind in left eye, as defined in [place]   Follow-up exam, less than 3 months since previous exam  Multiple joint pain Chronic pain and joint discomfort in shoulders, neck, elbows, and wrists, accompanied by numbness, tingling, and sleep disturbances. Neurology and orthopedics are involved, with planned nerve conduction studies. Rheumatology referral is pending to investigate potential underlying causes, possibly related to low vitamin D levels.  He is informed about the potential discomfort of nerve conduction studies and consents to proceed. - Complete nerve conduction studies - Schedule rheumatology appointment - Continue high-dose vitamin D supplementation  Essential hypertension Elevated blood pressure with fluctuating readings. Currently on amlodipine, with confirmed adherence. Plan to increase dosage for better control, with instructions to monitor for side effects such as dizziness or vision changes. Advised to keep lower dose available in case of side effects. - Increase amlodipine dose to 10 mg daily - Monitor blood pressure regularly - Report any side effects such as dizziness or vision changes  Dyspnea on exertion Experiences exertional dyspnea affecting daily activities, likely secondary to COPD. Significant smoking history contributing to symptoms. Low-dose lung CT planned for lung cancer screening due to smoking history. Informed about the importance of early lung cancer detection through screening. - Refer for low-dose lung CT for cancer screening - Order pulmonary function test - Encourage smoking cessation  Hyperlipidemia On rosuvastatin for cholesterol management, well-tolerated. Advised to continue morning dosing. Cholesterol levels to be  re-evaluated at next visit. - Continue rosuvastatin - Re-evaluate cholesterol levels at next visit  General Health Maintenance Has not received flu or COVID vaccines this year and declined them. History of multiple pneumonias; advised to receive pneumonia vaccine. Shingles vaccine recommended to prevent complications, including vision and hearing loss. Tetanus vaccine is due. Agrees to receive pneumonia and shingles vaccines today, with tetanus vaccine planned for later. Informed that vaccines may cause flu-like symptoms for up to 72 hours, as well as pain at the injection site. - Administer pneumonia vaccine - Administer shingles vaccine - Schedule tetanus vaccine for next visit, along with second shingles vaccine.   Follow-up Advised to follow up in three months for ongoing management and monitoring of treatment response. Reminded to schedule rheumatology appointment. - Schedule follow-up appointment in three months - Contact rheumatology for appointment scheduling   Return in about 3 months (around 02/03/2024) for chronic f/u.      I discussed the assessment and treatment plan with the patient  The patient was provided an opportunity to ask questions and all were answered. The patient agreed with the plan and demonstrated an understanding of the instructions.   The patient was advised to call back or seek an in-person evaluation if the symptoms worsen or if the condition fails to improve as anticipated.    Sherlyn Hay, DO  [facility] [phone] (phone) [phone] (fax)  [facility]

## 2023-11-11 ENCOUNTER — Other Ambulatory Visit: Payer: Self-pay

## 2023-11-11 ENCOUNTER — Telehealth: Payer: Self-pay

## 2023-11-11 DIAGNOSIS — G8929 Other chronic pain: Secondary | ICD-10-CM

## 2023-11-11 NOTE — Telephone Encounter (Signed)
 Called patient and advised him to go to [facility] for x rays between today and Wednesday patient agrees to have them done

## 2023-11-13 ENCOUNTER — Ambulatory Visit
Admission: RE | Admit: 2023-11-13 | Discharge: 2023-11-13 | Disposition: A | Discharge status: Home / Self Care | Attending: Orthopedic Surgery | Admitting: Orthopedic Surgery

## 2023-11-13 ENCOUNTER — Ambulatory Visit
Admission: RE | Admit: 2023-11-13 | Discharge: 2023-11-13 | Disposition: A | Discharge status: Home / Self Care | Source: Ambulatory Visit | Attending: Orthopedic Surgery

## 2023-11-13 DIAGNOSIS — M25511 Pain in right shoulder: Secondary | ICD-10-CM | POA: Diagnosis present

## 2023-11-13 DIAGNOSIS — G8929 Other chronic pain: Secondary | ICD-10-CM | POA: Insufficient documentation

## 2023-11-13 DIAGNOSIS — M25512 Pain in left shoulder: Secondary | ICD-10-CM | POA: Diagnosis present

## 2023-11-14 ENCOUNTER — Ambulatory Visit: Admitting: Orthopedic Surgery

## 2023-11-14 ENCOUNTER — Encounter: Payer: Self-pay | Admitting: Orthopedic Surgery

## 2023-11-14 ENCOUNTER — Ambulatory Visit (INDEPENDENT_AMBULATORY_CARE_PROVIDER_SITE_OTHER): Admitting: Neurology

## 2023-11-14 VITALS — BP 149/90 | HR 87 | Ht 67.0 in | Wt 148.0 lb

## 2023-11-14 DIAGNOSIS — G8929 Other chronic pain: Secondary | ICD-10-CM

## 2023-11-14 DIAGNOSIS — M25511 Pain in right shoulder: Secondary | ICD-10-CM

## 2023-11-14 DIAGNOSIS — M545 Low back pain, unspecified: Secondary | ICD-10-CM

## 2023-11-14 DIAGNOSIS — M25512 Pain in left shoulder: Secondary | ICD-10-CM

## 2023-11-14 DIAGNOSIS — R531 Weakness: Secondary | ICD-10-CM | POA: Diagnosis not present

## 2023-11-14 DIAGNOSIS — R251 Tremor, unspecified: Secondary | ICD-10-CM

## 2023-11-14 DIAGNOSIS — R202 Paresthesia of skin: Secondary | ICD-10-CM

## 2023-11-14 NOTE — Patient Instructions (Signed)

## 2023-11-14 NOTE — Progress Notes (Signed)
 Orthopaedic Clinic Return  Assessment: Lee Mueller is a 58 y.o. male with the following: Bilateral shoulder pain.  Plan: Lee Mueller has bilateral shoulder pain.  No specific injury.  Pain is progressively getting worse.  It does affect his motion.  It is getting harder for him to put his clothes on.  Radiographs demonstrate some mild degenerative changes.  No concern for proximal humeral migration.  Base complaint at this time is pain, so I recommended injections.  These were completed in clinic today.  He will follow-up as needed.  Procedure note injection - Right shoulder    Verbal consent was obtained to inject the right shoulder, subacromial space Timeout was completed to confirm the site of injection.   The skin was prepped with alcohol and ethyl chloride was sprayed at the injection site.  A 21-gauge needle was used to inject 40 mg of Depo-Medrol and 1% lidocaine (4 cc) into the subacromial space of the right shoulder using a posterolateral approach.  There were no complications.  A sterile bandage was applied.    Procedure note injection Left shoulder    Verbal consent was obtained to inject the left shoulder, subacromial space Timeout was completed to confirm the site of injection.  The skin was prepped with alcohol and ethyl chloride was sprayed at the injection site.  A 21-gauge needle was used to inject 40 mg of Depo-Medrol and 1% lidocaine (4 cc) into the subacromial space of the left shoulder using a posterolateral approach.  There were no complications. A sterile bandage was applied.   Follow-up: Return if symptoms worsen or fail to improve.   Subjective:  Chief Complaint  Patient presents with   Shoulder Pain    Bil shoulder pain  for months now     History of Present Illness: Lee Mueller is a 58 y.o. male who returns to clinic for repeat evaluation of bilateral shoulder pain.  Right-hand-dominant.  He has had pain in both shoulders for couple months.  It  is progressively worsening.  It is affecting his function.  Pain gets worse at night.  No specific injury.  Review of Systems: No fevers or chills No numbness or tingling No chest pain No shortness of breath No bowel or bladder dysfunction No GI distress No headaches   Objective: BP (!) 149/90   Pulse 87   Ht 5\' 7"  (1.702 m)   Wt 148 lb (67.1 kg)   BMI 23.18 kg/m   Physical Exam:  Alert and oriented.  No acute distress.  Bilateral shoulders without deformity.  No atrophy.  Restricted forward flexion bilaterally.  Pain with deltoid resistance, although he has good strength.  Good strength the supraspinatus, also with some pain.  He has good external rotation at his side.  IMAGING: I personally ordered and reviewed the following images:   X-rays of bilateral shoulders were available in clinic today.  No acute injuries.  There is some loss of glenohumeral joint space.  No obvious osteophytes.  No proximal humeral migration.  Oliver Barre, MD 11/14/2023 10:17 AM

## 2023-11-14 NOTE — Procedures (Unsigned)
 [facility]  [address] [place], [address]  [place], [place] [address] Tel: [phone] Fax: [phone] Test Date:  11/14/2023  Patient: Lee Mueller DOB: Mar 06, 1966 Physician: Nita Sickle, DO  Sex: Male Height: 5\' 7"  Ref Phys: Drake Leach, Cordelia Poche  ID#: [id number]   Technician:    History: This is a 58 year old man referred for evaluation of bilateral lower extremity paresthesias.  NCV & EMG Findings: Electrodiagnostic testing of the right lower extremity and additional studies of the left shows: Bilateral sural and superficial peroneal sensory responses are within normal limits. Bilateral peroneal and tibial motor responses are within normal limits. Bilateral tibial H reflex studies are within normal limits. There is no evidence of active or chronic motor axonal changes affecting any of the tested muscles.  Motor unit configuration and recruitment pattern is within normal limits.   Impression: This is a normal study of the lower extremities.  In particular, there is no evidence of a large fiber sensorimotor polyneuropathy or lumbosacral radiculopathy.    ___________________________ Nita Sickle, DO    Nerve Conduction Studies   Stim Site NR Peak (ms) Norm Peak (ms) O-P Amp (V) Norm O-P Amp  Left Sup Peroneal Anti Sensory (Ant Lat Mall)  32 C  12 cm    1.9 <4.6 11.8 >3  Right Sup Peroneal Anti Sensory (Ant Lat Mall)  32 C  12 cm    2.1 <4.6 12.1 >3  Left Sural Anti Sensory (Lat Mall)  32 C  Calf    2.6 <4.6 15.2 >3  Right Sural Anti Sensory (Lat Mall)  32 C  Calf    2.6 <4.6 15.4 >3     Stim Site NR Onset (ms) Norm Onset (ms) O-P Amp (mV) Norm O-P Amp Site1 Site2 Delta-0 (ms) Dist (cm) Vel (m/s) Norm Vel (m/s)  Left Peroneal Motor (Ext Dig Brev)  32 C  Ankle    4.0 <6.0 2.8 >2.5 B Fib Ankle 7.0 38.0 54 >40  B Fib    11.0  2.4  Poplt B Fib 1.7 8.0 47 >40  Poplt    12.7  2.3         Right Peroneal Motor (Ext Dig Brev)  32 C  Ankle    3.0 <6.0 4.9  >2.5 B Fib Ankle 7.5 38.0 51 >40  B Fib    10.5  4.8  Poplt B Fib 1.6 8.0 50 >40  Poplt    12.1  4.6         Left Tibial Motor (Abd Hall Brev)  32 C  Ankle    3.1 <6.0 6.3 >4 Knee Ankle 8.6 42.0 49 >40  Knee    11.7  5.5         Right Tibial Motor (Abd Hall Brev)  32 C  Ankle    3.2 <6.0 5.5 >4 Knee Ankle 8.4 43.0 51 >40  Knee    11.6  4.2          Electromyography   Side Muscle Ins.Act Fibs Fasc Recrt Amp Dur Poly Activation Comment  Right AntTibialis Nml Nml Nml Nml Nml Nml Nml Nml N/A  Right Gastroc Nml Nml Nml Nml Nml Nml Nml Nml N/A  Right Flex Dig Long Nml Nml Nml Nml Nml Nml Nml Nml N/A  Right RectFemoris Nml Nml Nml Nml Nml Nml Nml Nml N/A  Right BicepsFemS Nml Nml Nml Nml Nml Nml Nml Nml N/A  Right GluteusMed Nml Nml Nml Nml Nml Nml Nml Nml N/A  Left AntTibialis Nml Nml Nml Nml Nml Nml Nml Nml N/A  Left Gastroc Nml Nml Nml Nml Nml Nml Nml Nml N/A  Left Flex Dig Long Nml Nml Nml Nml Nml Nml Nml Nml N/A  Left RectFemoris Nml Nml Nml Nml Nml Nml Nml Nml N/A  Left GluteusMed Nml Nml Nml Nml Nml Nml Nml Nml N/A  Left BicepsFemS Nml Nml Nml Nml Nml Nml Nml Nml N/A      Waveforms:

## 2023-11-15 ENCOUNTER — Ambulatory Visit: Admitting: Neurology

## 2023-11-15 DIAGNOSIS — G5622 Lesion of ulnar nerve, left upper limb: Secondary | ICD-10-CM

## 2023-11-15 DIAGNOSIS — R202 Paresthesia of skin: Secondary | ICD-10-CM

## 2023-11-15 DIAGNOSIS — G5603 Carpal tunnel syndrome, bilateral upper limbs: Secondary | ICD-10-CM

## 2023-11-15 NOTE — Progress Notes (Signed)
 Telephone Visit- Progress Note: Referring Physician:  Carlean Charter, DO [address] [place],  [place] [address]  Primary Physician:  Carlean Charter, DO  This visit was performed via telephone.  Patient location: home Provider location: office  I spent a total of 12 minutes non-face-to-face activities for this visit on the date of this encounter including review of current clinical condition and response to treatment.    Patient has given verbal consent to this telephone visits and we reviewed the limitations of a telephone visit. Patient wishes to proceed.    Chief Complaint:  Review EMG results  History of Present Illness: Lee Mueller is a 58 y.o. male has a history of HTN, tremors of both hands, history of ETOH dependence. He is sober.    Last seen by me on 11/04/23 for neck pain, balance/dexterity issues, and constant LBP.   Cervical MRI with spondylosis and mild multilevel foraminal stenosis. No central stenosis.    Thoracic MRI with no compression noted.    Lumbar MRI from last April shows right lateral disc and mild bilateral foraminal stenosis at L4-L5 and mild right foraminal stenosis L5-S1. LBP likely due to spondylosis, this would not explain numbness/tingling in legs from knees down.   Phone visit scheduled to review his upper and lower extremity EMG results.    His symptoms are the same. He continues with constant neck pain with bilateral shoulder pain. No radicular pain in his arms, but he has pain in his wrists/hands. He has constant numbness and tingling in hands that is worse at night. He notes his hands "lock up." He has weakness in his hands.   He had bilateral shoulder injections by ortho on 11/14/23. He has seen some improvement in his pain.    He has constant LBP with no leg pain, but he has constant numbness/tingling and weakness in both legs from knees into his feet. No pain in his thighs. No alleviating or aggravating factors.    He  smokes 1/2 PPD x 40 years.    Bowel/Bladder Dysfunction: none   Conservative measures:  Physical therapy: patient has not participated in PT for back or neck Multimodal medical therapy including regular antiinflammatories:  flexeril, meloxicam Injections:  has not had epidural steroid injections   Past Surgery: no past surgical history   Lee Mueller has issues with hand dexterity and balance issues.     Exam: No exam done as this was a telephone encounter.     Imaging: EMG of bilateral upper extremities dated 11/15/23:  Impression: Left ulnar neuropathy with slowing across the elbow, demyelinating, mild-moderate. Bilateral median neuropathy at or distal to the wrist, consistent with a clinical diagnosis of carpal tunnel syndrome.  Overall, these findings are mild and worse on the left.     ___________________________ Reyna Cava, DO   EMG of bilateral lower extremities dated 11/14/23:  Impression: This is a normal study of the lower extremities.  In particular, there is no evidence of a large fiber sensorimotor polyneuropathy or lumbosacral radiculopathy.       ___________________________ Reyna Cava, DO    Assessment and Plan: Lee Mueller has constant neck pain with bilateral shoulder pain. No radicular pain in his arms, but he has pain in his wrists/hands. He has constant numbness and tingling in hands that is worse at night.   He continues with balance/dexterity issues.    Cervical MRI with spondylosis and mild multilevel foraminal stenosis. No central stenosis.   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   Thoracic MRI with no compression noted.    He has constant LBP with no leg pain, but he has constant numbness/tingling and weakness in both legs from knees into his feet.    Lumbar MRI from last April shows right lateral disc and mild bilateral foraminal stenosis at L4-L5 and mild right  foraminal stenosis L5-S1. LBP likely due to spondylosis.   EMG of bilateral lower extremities was normal.    Treatment options discussed with patient and following plan made:   - PT for cervical and lumbar spine recommended. He declines.  - Referral to pain management ([facility]) to discuss cervical and lumbar injections.  - Recommend carpal tunnel splints at night. Can get OTC.  - Referral to Dr. Felipe Horton for bilateral CTS and left ulnar neuropathy. Appointment scheduled.  - Referral to neurology for balance/dexterity issues and numbness/tingling in legs that is not spine mediated. Will have him see [facility] Lydia Sams).  - Follow up with me in 6-8 weeks for follow up after cervical/lumbar injections.   Lucetta Russel PA-C Neurosurgery

## 2023-11-15 NOTE — Procedures (Signed)
 [facility]  [address] [place], [address]  [place], [place] [address] Tel: [phone] Fax: [phone] Test Date:  11/15/2023  Patient: Lee Mueller DOB: 06/13/1966 Physician: Nita Sickle, DO  Sex: Male Height: 5\' 7"  Ref Phys: Drake Leach, Cordelia Poche  ID#: [id number]   Technician:    History: This is a 58 year old man referred for evaluation of bilateral upper extremity paresthesias.  NCV & EMG Findings: Extensive electrodiagnostic testing of the right upper extremity and additional studies of the left shows:  Left median and ulnar sensory responses show prolonged latency (L4.2, L3.4 ms).  Right mixed palmar sensory responses show prolonged latency.  Right median and ulnar motor responses are within normal limits. Bilateral median and right ulnar motor responses are within normal limits.  Left ulnar motor response prolonged latency (3.2 ms) and decreased conduction velocity (A Elbow-B Elbow, 38 m/s).   There is no evidence of active or chronic motor axonal loss changes affecting any of the tested muscles.  Motor unit configuration and recruitment pattern is within normal limits.  Impression: Left ulnar neuropathy with slowing across the elbow, demyelinating, mild-moderate. Bilateral median neuropathy at or distal to the wrist, consistent with a clinical diagnosis of carpal tunnel syndrome.  Overall, these findings are mild and worse on the left.   ___________________________ Nita Sickle, DO    Nerve Conduction Studies   Stim Site NR Peak (ms) Norm Peak (ms) O-P Amp (V) Norm O-P Amp  Left Median Anti Sensory (2nd Digit)  32 C  Wrist    *4.2 <3.8 21.3 >10  Right Median Anti Sensory (2nd Digit)  32 C  Wrist    3.8 <3.8 21.6 >10  Left Ulnar Anti Sensory (5th Digit)  32 C  Wrist    *3.4 <3.2 34.6 >5  Right Ulnar Anti Sensory (5th Digit)  32 C  Wrist    3.0 <3.2 31.9 >5     Stim Site NR Onset (ms) Norm Onset (ms) O-P Amp (mV) Norm O-P Amp Site1 Site2 Delta-0 (ms)  Dist (cm) Vel (m/s) Norm Vel (m/s)  Left Median Motor (Abd Poll Brev)  32 C  Wrist    3.8 <4.0 11.0 >5 Elbow Wrist 6.2 32.0 52 >50  Elbow    10.0  10.8         Right Median Motor (Abd Poll Brev)  32 C  Wrist    3.9 <4.0 11.6 >5 Elbow Wrist 5.7 32.0 56 >50  Elbow    9.6  11.5         Left Ulnar Motor (Abd Dig Minimi)  32 C  Wrist    *3.2 <3.1 8.8 >7 B Elbow Wrist 4.4 23.0 52 >50  B Elbow    7.6  7.8  A Elbow B Elbow 2.6 10.0 *38 >50  A Elbow    10.2  7.3         Right Ulnar Motor (Abd Dig Minimi)  32 C  Wrist    2.7 <3.1 10.2 >7 B Elbow Wrist 3.7 23.0 62 >50  B Elbow    6.4  9.9  A Elbow B Elbow 1.5 10.0 67 >50  A Elbow    7.9  9.3            Stim Site NR Peak (ms) Norm Peak (ms) P-T Amp (V) Site1 Site2 Delta-P (ms) Norm Delta (ms)  Right Median/Ulnar Palm Comparison (Wrist - 8cm)  32 C  Median Palm    *2.3 <2.2 71.9 Median Palm Ulnar  Palm *0.6   Ulnar Palm    1.7 <2.2 47.8       Electromyography   Side Muscle Ins.Act Fibs Fasc Recrt Amp Dur Poly Activation Comment  Right 1stDorInt Nml Nml Nml Nml Nml Nml Nml Nml N/A  Right Abd Poll Brev Nml Nml Nml Nml Nml Nml Nml Nml N/A  Right PronatorTeres Nml Nml Nml Nml Nml Nml Nml Nml N/A  Right Biceps Nml Nml Nml Nml Nml Nml Nml Nml N/A  Right Triceps Nml Nml Nml Nml Nml Nml Nml Nml N/A  Right Deltoid Nml Nml Nml Nml Nml Nml Nml Nml N/A  Left 1stDorInt Nml Nml Nml Nml Nml Nml Nml Nml N/A  Left Abd Poll Brev Nml Nml Nml Nml Nml Nml Nml Nml N/A  Left PronatorTeres Nml Nml Nml Nml Nml Nml Nml Nml N/A  Left Biceps Nml Nml Nml Nml Nml Nml Nml Nml N/A  Left Triceps Nml Nml Nml Nml Nml Nml Nml Nml N/A  Left Deltoid Nml Nml Nml Nml Nml Nml Nml Nml N/A  Right Abd Dig Min Nml Nml Nml Nml Nml Nml Nml Nml N/A  Right FlexCarpiUln Nml Nml Nml Nml Nml Nml Nml Nml N/A      Waveforms:

## 2023-11-18 ENCOUNTER — Encounter: Payer: Self-pay | Admitting: Orthopedic Surgery

## 2023-11-18 ENCOUNTER — Ambulatory Visit: Admitting: Orthopedic Surgery

## 2023-11-18 ENCOUNTER — Ambulatory Visit (INDEPENDENT_AMBULATORY_CARE_PROVIDER_SITE_OTHER): Admitting: Orthopedic Surgery

## 2023-11-18 DIAGNOSIS — R200 Anesthesia of skin: Secondary | ICD-10-CM

## 2023-11-18 DIAGNOSIS — M47812 Spondylosis without myelopathy or radiculopathy, cervical region: Secondary | ICD-10-CM | POA: Diagnosis not present

## 2023-11-18 DIAGNOSIS — R2689 Other abnormalities of gait and mobility: Secondary | ICD-10-CM

## 2023-11-18 DIAGNOSIS — M47816 Spondylosis without myelopathy or radiculopathy, lumbar region: Secondary | ICD-10-CM

## 2023-11-18 DIAGNOSIS — G5603 Carpal tunnel syndrome, bilateral upper limbs: Secondary | ICD-10-CM

## 2023-11-18 DIAGNOSIS — G5622 Lesion of ulnar nerve, left upper limb: Secondary | ICD-10-CM

## 2023-11-18 DIAGNOSIS — M4802 Spinal stenosis, cervical region: Secondary | ICD-10-CM

## 2023-11-18 DIAGNOSIS — R278 Other lack of coordination: Secondary | ICD-10-CM

## 2023-11-18 DIAGNOSIS — M48061 Spinal stenosis, lumbar region without neurogenic claudication: Secondary | ICD-10-CM

## 2023-11-25 NOTE — Progress Notes (Unsigned)
 Referring Physician:  Carlean Charter, DO [address] [place],  [place] [address]  Primary Physician:  Carlean Charter, DO  History of Present Illness: 11/27/2023 [name] is here today with a chief complaint of bilateral shoulder, elbow, wrist, hand pain.  He has a history of hypertension and tremors as well as a history of alcohol dependence.  He is currently sober and doing well.  He gets severe pain in his shoulders neck wrist elbows and hands.  He feels aching throughout the day.  Sometimes feels low back and hip pain as well.  He was referred to me for evaluation of possible carpal tunnel and cubital tunnel syndrome.  He had a recent EMG which demonstrated mild disease.   Conservative measures:  Physical therapy: patient has not participated in PT for back or neck Multimodal medical therapy including regular antiinflammatories:  flexeril , meloxicam  Injections:  has not had epidural steroid injections   Past Surgery: no past surgical history   Lee Mueller has issues with hand dexterity and balance issues.       Exam: On physical examination he has soreness to passive range of motion in his wrists elbows shoulders hips and hands.  He does not have clear neurologic deficits, but does have some guarding type pain and giveaway noted on certain maneuvers such as finger abduction and wrist flexion.   Imaging: EMG of bilateral upper extremities dated 11/15/23:  Impression: Left ulnar neuropathy with slowing across the elbow, demyelinating, mild-moderate. Bilateral median neuropathy at or distal to the wrist, consistent with a clinical diagnosis of carpal tunnel syndrome.  Overall, these findings are mild and worse on the left.     ___________________________ Reyna Cava, DO     EMG of bilateral lower extremities dated 11/14/23:   Impression: This is a normal study of the lower extremities.  In particular, there is no evidence of a large fiber  sensorimotor polyneuropathy or lumbosacral radiculopathy.  I have personally reviewed the images and electrodiagnostics and agree with the above interpretation.  I reviewed his other labs, it does appear that he has some positive rheumatology labs and he has been referred for further evaluation but has not been able to make that appointment yet.  Is currently scheduled for September.  Assessment and Plan: Lee Mueller is a pleasant 58 y.o. male with diffuse joint pain that does not have a clear neurologic cause from either compression, neuropathy, or radiculopathy.  Physical examination he has pain to active and passive range of motion in his hands wrists elbows shoulders hips and back.  He does not appear to have a localizing neurologic examination.  He has been referred for EMG and nerve conduction study which demonstrated a possible left ulnar neuropathy and a mild bilateral median neuropathy at the wrist.  His symptoms are not overly consistent with this as a clinical etiology.  He states that this was severe pain his in his joints and into his not get associated with significant numbness in his median distribution or ulnar distribution.  I reviewed his neurologic imaging which did not show any evidence of compression.  I reviewed his previous laboratory workup which demonstrated some positive rheumatoid factors, I feel that he would be best evaluated by rheumatologist.  I do not feel that he would benefit from a neurologic intervention at this point..   Thank you for involving me in the care of this patient.   Carroll Clamp MD/MSCR Neurosurgery - Peripheral Nerve Surgery

## 2023-11-27 ENCOUNTER — Encounter: Payer: Self-pay | Admitting: Neurosurgery

## 2023-11-27 ENCOUNTER — Ambulatory Visit (INDEPENDENT_AMBULATORY_CARE_PROVIDER_SITE_OTHER): Admitting: Neurosurgery

## 2023-11-27 VITALS — BP 142/86 | Ht 67.0 in | Wt 148.0 lb

## 2023-11-27 DIAGNOSIS — G5622 Lesion of ulnar nerve, left upper limb: Secondary | ICD-10-CM

## 2023-11-27 DIAGNOSIS — M25511 Pain in right shoulder: Secondary | ICD-10-CM | POA: Insufficient documentation

## 2023-11-27 DIAGNOSIS — M25512 Pain in left shoulder: Secondary | ICD-10-CM | POA: Diagnosis not present

## 2023-11-27 DIAGNOSIS — G5603 Carpal tunnel syndrome, bilateral upper limbs: Secondary | ICD-10-CM | POA: Diagnosis not present

## 2023-11-27 LAB — COLOGUARD: COLOGUARD: NEGATIVE

## 2023-12-04 ENCOUNTER — Encounter: Payer: Self-pay | Admitting: Ophthalmology

## 2023-12-05 ENCOUNTER — Encounter: Payer: Self-pay | Admitting: Ophthalmology

## 2023-12-05 NOTE — Anesthesia Preprocedure Evaluation (Signed)
 Anesthesia Evaluation  Patient identified by MRN, date of birth, ID band Patient awake    Reviewed: Allergy & Precautions, NPO status , Patient's Chart, lab work & pertinent test results  History of Anesthesia Complications Negative for: history of anesthetic complications  Airway Mallampati: IV   Neck ROM: Full    Dental  (+) Missing   Pulmonary Current Smoker (1/2 ppd) and Patient abstained from smoking.   Pulmonary exam normal breath sounds clear to auscultation       Cardiovascular hypertension, Normal cardiovascular exam Rhythm:Regular Rate:Normal     Neuro/Psych Alcohol use disorder, no intake in greater than 200 days; chronic pain    GI/Hepatic negative GI ROS,,,  Endo/Other  negative endocrine ROS    Renal/GU negative Renal ROS     Musculoskeletal   Abdominal   Peds  Hematology negative hematology ROS (+)   Anesthesia Other Findings   Reproductive/Obstetrics                             Anesthesia Physical Anesthesia Plan  ASA: 2  Anesthesia Plan: MAC   Post-op Pain Management:    Induction: Intravenous  PONV Risk Score and Plan: 0 and Treatment may vary due to age or medical condition, Midazolam and TIVA  Airway Management Planned: Natural Airway and Nasal Cannula  Additional Equipment:   Intra-op Plan:   Post-operative Plan:   Informed Consent: I have reviewed the patients History and Physical, chart, labs and discussed the procedure including the risks, benefits and alternatives for the proposed anesthesia with the patient or authorized representative who has indicated his/her understanding and acceptance.     Dental advisory given  Plan Discussed with: CRNA  Anesthesia Plan Comments: (LMA/GETA backup discussed.  Patient consented for risks of anesthesia including but not limited to:  - adverse reactions to medications - damage to eyes, teeth, lips or other  oral mucosa - nerve damage due to positioning  - sore throat or hoarseness - damage to heart, brain, nerves, lungs, other parts of body or loss of life  Informed patient about role of CRNA in peri- and intra-operative care.  Patient voiced understanding.)       Anesthesia Quick Evaluation

## 2023-12-06 ENCOUNTER — Encounter: Payer: Self-pay | Admitting: Family Medicine

## 2023-12-10 NOTE — Discharge Instructions (Signed)

## 2023-12-12 ENCOUNTER — Ambulatory Visit
Admission: RE | Admit: 2023-12-12 | Discharge: 2023-12-12 | Disposition: A | Discharge status: Home / Self Care | Attending: Ophthalmology | Admitting: Ophthalmology

## 2023-12-12 ENCOUNTER — Encounter
Admission: RE | Disposition: A | Discharge status: Home / Self Care | Payer: Self-pay | Source: Home / Self Care | Attending: Ophthalmology

## 2023-12-12 ENCOUNTER — Ambulatory Visit: Payer: Self-pay | Admitting: Anesthesiology

## 2023-12-12 ENCOUNTER — Other Ambulatory Visit: Payer: Self-pay

## 2023-12-12 ENCOUNTER — Encounter: Payer: Self-pay | Admitting: Ophthalmology

## 2023-12-12 DIAGNOSIS — Z79899 Other long term (current) drug therapy: Secondary | ICD-10-CM | POA: Diagnosis not present

## 2023-12-12 DIAGNOSIS — H2512 Age-related nuclear cataract, left eye: Secondary | ICD-10-CM | POA: Diagnosis present

## 2023-12-12 DIAGNOSIS — I10 Essential (primary) hypertension: Secondary | ICD-10-CM | POA: Diagnosis not present

## 2023-12-12 DIAGNOSIS — G8929 Other chronic pain: Secondary | ICD-10-CM | POA: Insufficient documentation

## 2023-12-12 DIAGNOSIS — F17210 Nicotine dependence, cigarettes, uncomplicated: Secondary | ICD-10-CM | POA: Diagnosis not present

## 2023-12-12 HISTORY — DX: Personal history of other diseases of the digestive system: Z87.19

## 2023-12-12 HISTORY — DX: Unspecified severe protein-calorie malnutrition: E43

## 2023-12-12 HISTORY — DX: Weakness: R53.1

## 2023-12-12 HISTORY — PX: CATARACT EXTRACTION W/PHACO: SHX586

## 2023-12-12 HISTORY — DX: Essential (primary) hypertension: I10

## 2023-12-12 HISTORY — DX: Tremor, unspecified: R25.1

## 2023-12-12 HISTORY — DX: Clubbing of fingers: R68.3

## 2023-12-12 HISTORY — DX: Personal history of other mental and behavioral disorders: Z86.59

## 2023-12-12 HISTORY — DX: Alcohol dependence, in remission: F10.21

## 2023-12-12 HISTORY — DX: Other specified abnormal findings of blood chemistry: R79.89

## 2023-12-12 SURGERY — PHACOEMULSIFICATION, CATARACT, WITH IOL INSERTION
Anesthesia: Monitor Anesthesia Care | Site: Eye | Laterality: Left

## 2023-12-12 MED ORDER — MIDAZOLAM HCL 2 MG/2ML IJ SOLN
INTRAMUSCULAR | Status: DC | PRN
  Administered 2023-12-12: 2 mg via INTRAVENOUS

## 2023-12-12 MED ORDER — MIDAZOLAM HCL 2 MG/2ML IJ SOLN
INTRAMUSCULAR | Status: AC
  Filled 2023-12-12: qty 2

## 2023-12-12 MED ORDER — SIGHTPATH DOSE#1 BSS IO SOLN
INTRAOCULAR | Status: DC | PRN
  Administered 2023-12-12: 15 mL via INTRAOCULAR

## 2023-12-12 MED ORDER — SIGHTPATH DOSE#1 NA HYALUR & NA CHOND-NA HYALUR IO KIT
PACK | INTRAOCULAR | Status: DC | PRN
  Administered 2023-12-12: 1 via OPHTHALMIC

## 2023-12-12 MED ORDER — ARMC OPHTHALMIC DILATING DROPS
1.0000 | OPHTHALMIC | Status: DC | PRN
  Administered 2023-12-12 (×3): 1 via OPHTHALMIC

## 2023-12-12 MED ORDER — FENTANYL CITRATE (PF) 100 MCG/2ML IJ SOLN
INTRAMUSCULAR | Status: DC | PRN
  Administered 2023-12-12: 50 ug via INTRAVENOUS

## 2023-12-12 MED ORDER — TETRACAINE HCL 0.5 % OP SOLN
OPHTHALMIC | Status: AC
  Filled 2023-12-12: qty 4

## 2023-12-12 MED ORDER — MOXIFLOXACIN HCL 0.5 % OP SOLN
OPHTHALMIC | Status: DC | PRN
  Administered 2023-12-12: .2 mL via OPHTHALMIC

## 2023-12-12 MED ORDER — ARMC OPHTHALMIC DILATING DROPS
OPHTHALMIC | Status: AC
Start: 2023-12-12 — End: ?
  Filled 2023-12-12: qty 0.5

## 2023-12-12 MED ORDER — SIGHTPATH DOSE#1 BSS IO SOLN
INTRAOCULAR | Status: DC | PRN
  Administered 2023-12-12: 93 mL via OPHTHALMIC

## 2023-12-12 MED ORDER — TETRACAINE HCL 0.5 % OP SOLN
1.0000 [drp] | OPHTHALMIC | Status: DC | PRN
Start: 2023-12-12 — End: 2023-12-12
  Administered 2023-12-12 (×3): 1 [drp] via OPHTHALMIC

## 2023-12-12 MED ORDER — FENTANYL CITRATE (PF) 100 MCG/2ML IJ SOLN
INTRAMUSCULAR | Status: AC
Start: 2023-12-12 — End: ?
  Filled 2023-12-12: qty 2

## 2023-12-12 MED ORDER — BALANCED SALT IO SOLN
INTRAOCULAR | Status: DC | PRN
  Administered 2023-12-12: 4 mL via INTRAOCULAR

## 2023-12-12 MED ORDER — BRIMONIDINE TARTRATE-TIMOLOL 0.2-0.5 % OP SOLN
OPHTHALMIC | Status: DC | PRN
  Administered 2023-12-12: 1 [drp] via OPHTHALMIC

## 2023-12-12 SURGICAL SUPPLY — 13 items
CATARACT SUITE SIGHTPATH (MISCELLANEOUS) ×1 IMPLANT
DISSECTOR HYDRO NUCLEUS 50X22 (MISCELLANEOUS) ×1 IMPLANT
DRSG TEGADERM 2-3/8X2-3/4 SM (GAUZE/BANDAGES/DRESSINGS) ×1 IMPLANT
FEE CATARACT SUITE SIGHTPATH (MISCELLANEOUS) ×1 IMPLANT
GLOVE BIOGEL PI IND STRL 8 (GLOVE) ×1 IMPLANT
GLOVE SURG LX STRL 7.5 STRW (GLOVE) ×1 IMPLANT
GLOVE SURG PROTEXIS BL SZ6.5 (GLOVE) ×1 IMPLANT
GLOVE SURG SYN 6.5 PF PI BL (GLOVE) ×1 IMPLANT
LENS IOL DIOP 18.0 (Intraocular Lens) ×1 IMPLANT
LENS IOL TECNIS MONO 18.0 (Intraocular Lens) IMPLANT
NDL FILTER BLUNT 18X1 1/2 (NEEDLE) ×1 IMPLANT
NEEDLE FILTER BLUNT 18X1 1/2 (NEEDLE) ×1 IMPLANT
SYR 3ML LL SCALE MARK (SYRINGE) ×1 IMPLANT

## 2023-12-12 NOTE — Anesthesia Postprocedure Evaluation (Signed)
 Anesthesia Post Note  Patient: Lee Mueller  Procedure(s) Performed: PHACOEMULSIFICATION, CATARACT, WITH IOL INSERTION 6.30 00:45.7 (Left: Eye)  Patient location during evaluation: PACU Anesthesia Type: MAC Level of consciousness: awake and alert, oriented and patient cooperative Pain management: pain level controlled Vital Signs Assessment: post-procedure vital signs reviewed and stable Respiratory status: spontaneous breathing, nonlabored ventilation and respiratory function stable Cardiovascular status: blood pressure returned to baseline and stable Postop Assessment: adequate PO intake Anesthetic complications: no   No notable events documented.   Last Vitals:  Vitals:   12/12/23 1025 12/12/23 1026  BP: 123/88 123/89  Pulse: (!) 50 (!) 50  Resp: 15 15  Temp:  36.5 C  SpO2: 97% 96%    Last Pain:  Vitals:   12/12/23 1026  TempSrc:   PainSc: 0-No pain                 Dorothey Gate

## 2023-12-12 NOTE — Transfer of Care (Signed)
 Immediate Anesthesia Transfer of Care Note  Patient: Lee Mueller  Procedure(s) Performed: PHACOEMULSIFICATION, CATARACT, WITH IOL INSERTION 6.30 00:45.7 (Left: Eye)  Patient Location: PACU  Anesthesia Type: MAC  Level of Consciousness: awake, alert  and patient cooperative  Airway and Oxygen Therapy: Patient Spontanous Breathing and Patient connected to supplemental oxygen  Post-op Assessment: Post-op Vital signs reviewed, Patient's Cardiovascular Status Stable, Respiratory Function Stable, Patent Airway and No signs of Nausea or vomiting  Post-op Vital Signs: Reviewed and stable  Complications: No notable events documented.

## 2023-12-12 NOTE — H&P (Signed)
 [facility]   Primary Care Physician:  Carlean Charter, DO Ophthalmologist: Dr. Meg Spina  Pre-Procedure History & Physical: HPI:  Lee Mueller is a 58 y.o. male here for cataract surgery.   Past Medical History:  Diagnosis Date   Alcohol use    Elevated liver function tests    Finger clubbing    History of alcohol dependence (HCC)    History of alcohol withdrawal delirium    History of alcoholic hepatitis    Hypertension    Right sided weakness    Severe protein-calorie malnutrition (HCC)    Tremor of both hands     Past Surgical History:  Procedure Laterality Date   TYMPANOSTOMY TUBE PLACEMENT Bilateral    around age 79    Prior to Admission medications   Medication Sig Start Date End Date Taking? Authorizing Provider  amLODipine  (NORVASC ) 10 MG tablet Take 1 tablet (10 mg total) by mouth daily. 11/04/23  Yes Pardue, Asencion Blacksmith, DO  cyanocobalamin  1000 MCG tablet Take 1 tablet (1,000 mcg total) by mouth daily. 11/04/23  Yes Pardue, Asencion Blacksmith, DO  cyclobenzaprine  (FLEXERIL ) 5 MG tablet Take 1-2 tablets (5-10 mg total) by mouth at bedtime as needed for muscle spasms. 08/19/23  Yes Pardue, Asencion Blacksmith, DO  folic acid  (FOLVITE ) 1 MG tablet Take 1 tablet (1 mg total) by mouth daily. 11/04/23  Yes Pardue, Asencion Blacksmith, DO  Multiple Vitamin (MULTIVITAMIN WITH MINERALS) TABS tablet Take 1 tablet by mouth daily. 11/04/23  Yes Pardue, Asencion Blacksmith, DO  rosuvastatin  (CRESTOR ) 5 MG tablet Take 1 tablet (5 mg total) by mouth daily. 08/24/23  Yes Pardue, Asencion Blacksmith, DO  thiamine  (VITAMIN B1) 100 MG tablet Take 1 tablet (100 mg total) by mouth daily. 11/04/23  Yes Pardue, Asencion Blacksmith, DO  Vitamin D , Ergocalciferol , (DRISDOL ) 1.25 MG (50000 UNIT) CAPS capsule Take 1 capsule (50,000 Units total) by mouth every 7 (seven) days. 08/24/23  Yes Pardue, Asencion Blacksmith, DO  albuterol  (VENTOLIN  HFA) 108 (90 Base) MCG/ACT inhaler Inhale 2 puffs into the lungs every 6 (six) hours as needed for wheezing or shortness of  breath. Patient not taking: Reported on 12/04/2023 11/04/23   Carlean Charter, DO    Allergies as of 11/19/2023   (No Known Allergies)    Family History  Problem Relation Age of Onset   Breast cancer Mother     Social History   Socioeconomic History   Marital status: Single    Spouse name: Not on file   Number of children: Not on file   Years of education: Not on file   Highest education level: Not on file  Occupational History   Not on file  Tobacco Use   Smoking status: Every Day    Current packs/day: 0.50    Average packs/day: 0.5 packs/day for 40.1 years (20.1 ttl pk-yrs)    Types: Cigarettes    Start date: 11/04/1983   Smokeless tobacco: Never  Vaping Use   Vaping status: Never Used  Substance and Sexual Activity   Alcohol use: Not Currently    Comment: 12/04/23- "221 days sober"   Drug use: Never   Sexual activity: Not Currently  Other Topics Concern   Not on file  Social History Narrative   Not on file   Social Drivers of Health   Financial Resource Strain: Not on file  Food Insecurity: No Food Insecurity (11/10/2022)   Hunger Vital Sign    Worried About Running Out of Food in the Last  Year: Never true    Ran Out of Food in the Last Year: Never true  Transportation Needs: Unmet Transportation Needs (11/10/2022)   PRAPARE - Administrator, Civil Service (Medical): Yes    Lack of Transportation (Non-Medical): No  Physical Activity: Not on file  Stress: Not on file  Social Connections: Not on file  Intimate Partner Violence: Not At Risk (11/10/2022)   Humiliation, Afraid, Rape, and Kick questionnaire    Fear of Current or Ex-Partner: No    Emotionally Abused: No    Physically Abused: No    Sexually Abused: No    Review of Systems: See HPI, otherwise negative ROS  Physical Exam: Ht 5\' 7"  (1.702 m)   Wt 67.1 kg   BMI 23.18 kg/m  General:   Alert, cooperative in NAD Head:  Normocephalic and atraumatic. Respiratory:  Normal work of  breathing. Cardiovascular:  RRR  Impression/Plan: Lee Mueller is here for cataract surgery.  Risks, benefits, limitations, and alternatives regarding cataract surgery have been reviewed with the patient.  Questions have been answered.  All parties agreeable.   Trudi Fus, MD  12/12/2023, 7:12 AM

## 2023-12-12 NOTE — Op Note (Signed)
 OPERATIVE NOTE  Lee Mueller [id number] 12/12/2023   PREOPERATIVE DIAGNOSIS: Nuclear sclerotic cataract left eye. H25.12   POSTOPERATIVE DIAGNOSIS: Nuclear sclerotic cataract left eye. H25.12   PROCEDURE:  Phacoemusification with posterior chamber intraocular lens placement of the left eye  Ultrasound time: Procedure(s): PHACOEMULSIFICATION, CATARACT, WITH IOL INSERTION 6.30 00:45.7 (Left)  LENS:   Implant Name Type Inv. Item Serial No. Manufacturer Lot No. LRB No. Used Action  LENS IOL DIOP 18.0 - [id number] Intraocular Lens LENS IOL DIOP 18.0 [id number] [employer]  Left 1 Implanted      SURGEON:  Rosy Cooper. Donalda Fruit, MD   ANESTHESIA:  Topical with tetracaine drops, augmented with 1% preservative-free intracameral lidocaine.   COMPLICATIONS:  None.   DESCRIPTION OF PROCEDURE:  The patient was identified in the holding room and transported to the operating room and placed in the supine position under the operating microscope.  The left eye was identified as the operative eye, which was prepped and draped in the usual sterile ophthalmic fashion.   A 1 millimeter clear-corneal paracentesis was made inferotemporally. Preservative-free 1% lidocaine mixed with 1:1,000 bisulfite-free aqueous solution of epinephrine was injected into the anterior chamber. The anterior chamber was then filled with Viscoat viscoelastic. A 2.4 millimeter keratome was used to make a clear-corneal incision superotemporally. A curvilinear capsulorrhexis was made with a cystotome and capsulorrhexis forceps. Balanced salt solution was used to hydrodissect and hydrodelineate the nucleus. Phacoemulsification was then used to remove the lens nucleus and epinucleus. The remaining cortex was then removed using the irrigation and aspiration handpiece. Provisc was then placed into the capsular bag to distend it for lens placement. A +18.00 D DCB00 intraocular lens was then injected into the capsular bag. The remaining viscoelastic  was aspirated.   Wounds were hydrated with balanced salt solution.  The anterior chamber was inflated to a physiologic pressure with balanced salt solution.  No wound leaks were noted. Moxifloxacin was injected intracamerally.  Timolol and Brimonidine drops were applied to the eye.  The patient was taken to the recovery room in stable condition without complications of anesthesia or surgery.  [employer] 12/12/2023, 10:15 AM

## 2023-12-12 NOTE — Addendum Note (Signed)
 Addendum  created 12/12/23 1037 by Lupe Handley, MD   Attestation recorded in Intraprocedure, Intraprocedure Attestations filed

## 2023-12-20 NOTE — Discharge Instructions (Signed)

## 2023-12-24 ENCOUNTER — Ambulatory Visit
Attending: Student in an Organized Health Care Education/Training Program | Admitting: Student in an Organized Health Care Education/Training Program

## 2023-12-24 ENCOUNTER — Encounter: Payer: Self-pay | Admitting: Student in an Organized Health Care Education/Training Program

## 2023-12-24 VITALS — BP 131/78 | HR 64 | Temp 99.0°F | Resp 16 | Ht 67.0 in | Wt 151.0 lb

## 2023-12-24 DIAGNOSIS — M4722 Other spondylosis with radiculopathy, cervical region: Secondary | ICD-10-CM

## 2023-12-24 DIAGNOSIS — M5416 Radiculopathy, lumbar region: Secondary | ICD-10-CM | POA: Diagnosis present

## 2023-12-24 DIAGNOSIS — M47812 Spondylosis without myelopathy or radiculopathy, cervical region: Secondary | ICD-10-CM | POA: Diagnosis present

## 2023-12-24 DIAGNOSIS — G8929 Other chronic pain: Secondary | ICD-10-CM | POA: Insufficient documentation

## 2023-12-24 DIAGNOSIS — G894 Chronic pain syndrome: Secondary | ICD-10-CM | POA: Diagnosis present

## 2023-12-24 DIAGNOSIS — M47816 Spondylosis without myelopathy or radiculopathy, lumbar region: Secondary | ICD-10-CM | POA: Diagnosis present

## 2023-12-24 DIAGNOSIS — M5412 Radiculopathy, cervical region: Secondary | ICD-10-CM | POA: Diagnosis present

## 2023-12-24 DIAGNOSIS — M4726 Other spondylosis with radiculopathy, lumbar region: Secondary | ICD-10-CM | POA: Diagnosis not present

## 2023-12-24 MED ORDER — GABAPENTIN 100 MG PO CAPS: ORAL_CAPSULE | ORAL | 0 refills | Status: AC

## 2023-12-24 NOTE — Progress Notes (Signed)
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 Lab Results  Component Value Date   NA 139 08/19/2023   K 4.8 08/19/2023   CL 101 08/19/2023   CALCIUM  9.8 08/19/2023   MG 2.0 11/12/2022   PHOS 4.1 11/10/2022     Hepatic Lab Results  Component Value Date   AST 16 08/19/2023   ALT 12 08/19/2023   ALBUMIN 5.0 (H) 08/19/2023   ALKPHOS 89 08/19/2023   AMMONIA 29 11/08/2022     ID Lab Results  Component Value Date   HIV Non Reactive 11/09/2022   HCVAB NON REACTIVE 11/09/2022     Bone Lab Results  Component Value Date   VD25OH 13.9 (L) 08/19/2023   TESTOFREE 7.3 11/08/2022   TESTOSTERONE  338 11/08/2022     Endocrine Lab Results  Component Value Date   GLUCOSE 102 (H) 08/19/2023   GLUCOSEU NEGATIVE  11/08/2022   TSH 2.097 11/08/2022   FREET4 0.53 (L) 11/08/2022   TESTOFREE 7.3 11/08/2022   TESTOSTERONE  338 11/08/2022     Neuropathy Lab Results  Component Value Date   VITAMINB12 375 11/08/2022   FOLATE 15.5 11/08/2022   HIV Non Reactive 11/09/2022     CNS No results found for: "COLORCSF", "APPEARCSF", "RBCCOUNTCSF", "WBCCSF", "POLYSCSF", "LYMPHSCSF", "EOSCSF", "PROTEINCSF", "GLUCCSF", "JCVIRUS", "CSFOLI", "IGGCSF", "LABACHR", "ACETBL"   Inflammation (CRP: Acute  ESR: Chronic) Lab Results  Component Value Date   ESRSEDRATE 7 08/19/2023     Rheumatology Lab Results  Component Value Date   RF <10.0 08/19/2023     Coagulation Lab Results  Component Value Date   PLT 235 11/09/2022     Cardiovascular Lab Results  Component Value Date   CKTOTAL 101 11/08/2022   HGB 14.1 11/09/2022   HCT 42.4 11/09/2022     Screening Lab Results  Component Value Date   HCVAB NON REACTIVE 11/09/2022   HIV Non Reactive 11/09/2022     Cancer No results found for: "CEA", "CA125", "LABCA2"   Allergens No results found for: "ALMOND", "APPLE", "ASPARAGUS", "AVOCADO", "BANANA", "BARLEY", "BASIL", "BAYLEAF", "GREENBEAN", "LIMABEAN", "WHITEBEAN", "BEEFIGE", "REDBEET", "BLUEBERRY", "BROCCOLI", "CABBAGE", "MELON", "CARROT", "CASEIN", "CASHEWNUT", "CAULIFLOWER", "CELERY"     Note: Lab results reviewed.  PFSH  Drug: Mr. Amsden  reports no history of drug use. Alcohol:  reports that he does not currently use alcohol. Tobacco:  reports that he has been smoking cigarettes. He started smoking about 40 years ago. He has a 20.1 pack-year smoking history. He has never used smokeless tobacco. Medical:  has a past medical history of Alcohol use, Elevated liver function tests, Finger clubbing, History of alcohol dependence (HCC), History  of alcohol withdrawal delirium, History of alcoholic hepatitis, Hypertension, Right sided weakness, Severe protein-calorie malnutrition (HCC), and Tremor of both  hands. Family: family history includes Breast cancer in his mother.  Past Surgical History:  Procedure Laterality Date   CATARACT EXTRACTION W/PHACO Left 12/12/2023   Procedure: PHACOEMULSIFICATION, CATARACT, WITH IOL INSERTION 6.30 00:45.7;  Surgeon: Trudi Fus, MD;  Location: [facility];  Service: Ophthalmology;  Laterality: Left;   TYMPANOSTOMY TUBE PLACEMENT Bilateral    around age 13   Active Ambulatory Problems    Diagnosis Date Noted   Complete loss of hair 11/08/2022   Finger clubbing 11/08/2022   Elevated liver enzymes 11/08/2022   Tremor of both hands 11/08/2022   Vitamin D  deficiency 11/09/2022   Right sided weakness 11/10/2022   Protein-calorie malnutrition, severe (HCC) 11/13/2022   Acute metabolic encephalopathy 11/14/2022   Essential hypertension 11/14/2022   History of alcohol dependence (HCC) 11/09/2022   Multiple joint pain 08/24/2023   Low back pain radiating to both legs 08/24/2023   Neuroforaminal stenosis of cervical spine 08/24/2023   Neuroforaminal stenosis of lumbar spine 08/24/2023   Establishing care with new doctor, encounter for 08/24/2023   Moderately severe major depressive disorder, single episode (HCC) 08/24/2023   Follow-up exam, less than 3 months since previous exam 11/04/2023   Dyspnea on exertion 11/04/2023   Mixed hyperlipidemia 11/04/2023   Legally blind in left eye, as defined in [place]  11/04/2023   Pain of both shoulder joints 11/27/2023   Cervical radicular pain 12/24/2023   Cervical spondylosis 12/24/2023   Lumbar radiculopathy 12/24/2023   Lumbar facet arthropathy 12/24/2023   Chronic pain syndrome 12/24/2023   Resolved Ambulatory Problems    Diagnosis Date Noted   Alcohol withdrawal delirium (HCC) 11/09/2022   Alcoholic hepatitis 11/09/2022   Past Medical History:  Diagnosis Date   Alcohol use    Elevated liver function tests    History of alcohol withdrawal delirium    History of alcoholic hepatitis     Hypertension    Severe protein-calorie malnutrition (HCC)    Constitutional Exam  General appearance: Well nourished, well developed, and well hydrated. In no apparent acute distress Vitals:   12/24/23 1358  BP: 131/78  Pulse: 64  Resp: 16  Temp: 99 F (37.2 C)  SpO2: 99%  Weight: 151 lb (68.5 kg)  Height: 5\' 7"  (1.702 m)   BMI Assessment: Estimated body mass index is 23.65 kg/m as calculated from the following:   Height as of this encounter: 5\' 7"  (1.702 m).   Weight as of this encounter: 151 lb (68.5 kg).  BMI interpretation table: BMI level Category Range association with higher incidence of chronic pain  <18 kg/m2 Underweight   18.5-24.9 kg/m2 Ideal body weight   25-29.9 kg/m2 Overweight Increased incidence by 20%  30-34.9 kg/m2 Obese (Class I) Increased incidence by 68%  35-39.9 kg/m2 Severe obesity (Class II) Increased incidence by 136%  >40 kg/m2 Extreme obesity (Class III) Increased incidence by 254%   Patient's current BMI Ideal Body weight  Body mass index is 23.65 kg/m. Ideal body weight: 66.1 kg (145 lb 11.6 oz) Adjusted ideal body weight: 67.1 kg (147 lb 13.3 oz)   BMI Readings from Last 4 Encounters:  12/24/23 23.65 kg/m  12/12/23 23.74 kg/m  11/27/23 23.18 kg/m  11/14/23 23.18 kg/m   Wt Readings from Last 4 Encounters:  12/24/23 151 lb (68.5 kg)  12/12/23 151 lb 9.6 oz (68.8 kg)  11/27/23 148 lb (67.1 kg)  11/14/23  148 lb (67.1 kg)    Psych/Mental status: Alert, oriented x 3 (person, place, & time)       Eyes: PERLA Respiratory: No evidence of acute respiratory distress  Cervical Spine Area Exam  Skin & Axial Inspection: No masses, redness, edema, swelling, or associated skin lesions Alignment: Symmetrical Functional ROM: Pain restricted ROM, bilaterally Stability: No instability detected Muscle Tone/Strength: Functionally intact. No obvious neuro-muscular anomalies detected. Sensory (Neurological): Neurogenic pain pattern and  MSK Palpation: No palpable anomalies             Upper Extremity (UE) Exam    Side: Right upper extremity  Side: Left upper extremity  Skin & Extremity Inspection: Skin color, temperature, and hair growth are WNL. No peripheral edema or cyanosis. No masses, redness, swelling, asymmetry, or associated skin lesions. No contractures.  Skin & Extremity Inspection: Skin color, temperature, and hair growth are WNL. No peripheral edema or cyanosis. No masses, redness, swelling, asymmetry, or associated skin lesions. No contractures.  Functional ROM: Pain restricted ROM for shoulder and elbow  Functional ROM: Pain restricted ROM for shoulder and elbow  Muscle Tone/Strength: Functionally intact. No obvious neuro-muscular anomalies detected.  Muscle Tone/Strength: Functionally intact. No obvious neuro-muscular anomalies detected.  Sensory (Neurological): Neuropathic pain pattern          Sensory (Neurological): Neuropathic pain pattern          Palpation: No palpable anomalies              Palpation: No palpable anomalies              Provocative Test(s):  Phalen's test: deferred Tinel's test: deferred Apley's scratch test (touch opposite shoulder):  Action 1 (Across chest): Decreased ROM Action 2 (Overhead): Decreased ROM Action 3 (LB reach): Decreased ROM   Provocative Test(s):  Phalen's test: deferred Tinel's test: deferred Apley's scratch test (touch opposite shoulder):  Action 1 (Across chest): deferred Action 2 (Overhead): deferred Action 3 (LB reach): deferred    Thoracic Spine Area Exam  Skin & Axial Inspection: No masses, redness, or swelling Alignment: Symmetrical Functional ROM: Pain restricted ROM Stability: No instability detected Muscle Tone/Strength: Functionally intact. No obvious neuro-muscular anomalies detected. Sensory (Neurological): Musculoskeletal pain pattern Muscle strength & Tone: No palpable anomalies Lumbar Spine Area Exam  Skin & Axial Inspection: No masses,  redness, or swelling Alignment: Symmetrical Functional ROM: Pain restricted ROM affecting both sides Stability: No instability detected Muscle Tone/Strength: Functionally intact. No obvious neuro-muscular anomalies detected. Sensory (Neurological): Musculoskeletal pain pattern and dermatomal  Gait & Posture Assessment  Ambulation: Unassisted Gait: Relatively normal for age and body habitus Posture: WNL  Lower Extremity Exam    Side: Right lower extremity  Side: Left lower extremity  Stability: No instability observed          Stability: No instability observed          Skin & Extremity Inspection: Skin color, temperature, and hair growth are WNL. No peripheral edema or cyanosis. No masses, redness, swelling, asymmetry, or associated skin lesions. No contractures.  Skin & Extremity Inspection: Skin color, temperature, and hair growth are WNL. No peripheral edema or cyanosis. No masses, redness, swelling, asymmetry, or associated skin lesions. No contractures.  Functional ROM: Pain restricted ROM for hip and knee joints          Functional ROM: Pain restricted ROM for hip and knee joints          Muscle Tone/Strength: Functionally intact. No obvious neuro-muscular anomalies detected.  Muscle  Tone/Strength: Functionally intact. No obvious neuro-muscular anomalies detected.  Sensory (Neurological): Neurogenic pain pattern        Sensory (Neurological): Neurogenic pain pattern        DTR: Patellar: deferred today Achilles: deferred today Plantar: deferred today  DTR: Patellar: deferred today Achilles: deferred today Plantar: deferred today  Palpation: No palpable anomalies  Palpation: No palpable anomalies    Assessment  Primary Diagnosis & Pertinent Problem List: The primary encounter diagnosis was Cervical radicular pain. Diagnoses of Cervical facet joint syndrome, Cervical spondylosis, Chronic radicular lumbar pain, Lumbar facet arthropathy, Lumbar radiculopathy, and Chronic pain  syndrome were also pertinent to this visit.  Visit Diagnosis (New problems to examiner): 1. Cervical radicular pain   2. Cervical facet joint syndrome   3. Cervical spondylosis   4. Chronic radicular lumbar pain   5. Lumbar facet arthropathy   6. Lumbar radiculopathy   7. Chronic pain syndrome    Plan of Care (Initial workup plan)  Assessment and Plan    Cervical disc herniation with radiculopathy   Chronic cervical disc herniation with radiculopathy presents with nerve compression and pain radiating into arms and fingers, resembling sciatica. Significant arthritis is noted at C3-C4, C4-C5, and C5-C6. He has not received prior treatment. Administer a cervical epidural steroid injection to target nerve compression and alleviate radiating pain. Initiate gabapentin at 100 mg TID for 10 days, then increase to 200 mg TID for 10 days, followed by 300 mg TID for 40 days for neuropathic pain management.  Cervical spondylosis   Mild to moderate cervical spondylosis contributes to neck pain, with no severe degeneration observed. Pain may refer to shoulders and arms. Consider medial branch nerve blocks for arthritic pain in the neck after addressing cervical radiculopathy.  Arthritis of shoulder and wrist   Progressive arthritis affects shoulders and wrists, causing pain and difficulty with fine motor tasks. Previous shoulder injections provided only temporary relief. There is a delay in rheumatology consultation.  Depression   Comorbid depression is associated with chronic pain and disability. He is not receiving current psychiatric treatment. Pain management may improve his mood, and gabapentin may aid in mood improvement by alleviating pain. Consider referral to a psychiatrist if depression does not improve with pain management.        Procedure Orders         Cervical Epidural Injection     Pharmacotherapy (current): Medications ordered:  Meds ordered this encounter  Medications    gabapentin (NEURONTIN) 100 MG capsule    Sig: Take 1 capsule (100 mg total) by mouth 3 (three) times daily for 10 days, THEN 2 capsules (200 mg total) 3 (three) times daily for 10 days, THEN 3 capsules (300 mg total) 3 (three) times daily.    Dispense:  450 capsule    Refill:  0   Medications administered during this visit: Lee Mueller had no medications administered during this visit.   Analgesic Pharmacotherapy:  Opioid Analgesics: N/A  Membrane stabilizer: Trial of gabapentin.  Future considerations include Cymbalta, Lyrica  Muscle relaxant: To be determined at a later time  NSAID: To be determined at a later time  Other analgesic(s): To be determined at a later time   Interventional management options: Mr. Daniello was informed that there is no guarantee that he would be a candidate for interventional therapies. The decision will be based on the results of diagnostic studies, as well as Mr. Fickle's risk profile.  Procedure(s) under consideration:  Cervical epidural steroid injection Cervical facet  manage nerve block Lumbar epidural steroid injection Lumbar facet medial branch nerve block    Provider-requested follow-up: Return in about 15 days (around 01/08/2024) for C-ESI, in clinic [facility].  Future Appointments  Date Time Provider Department Center  01/01/2024  8:10 AM Patel, Donika K, DO [facility] None  01/15/2024  1:30 PM Lucetta Russel, PA-C [facility] None  02/03/2024  1:20 PM Carlean Charter, DO [facility]  04/22/2024  8:20 AM Nicholas Bari, MD [facility] None  05/22/2024 11:00 AM Nicholas Bari, MD [facility] None   I discussed the assessment and treatment plan with the patient. The patient was provided an opportunity to ask questions and all were answered. The patient agreed with the plan and demonstrated an understanding of the instructions.  Patient advised to call back or seek an in-person evaluation if the symptoms or condition worsens.  Duration of encounter: .   Total time on encounter, as per AMA guidelines included both the face-to-face and non-face-to-face time personally spent by the physician and/or other qualified health care professional(s) on the day of the encounter (includes time in activities that require the physician or other qualified health care professional and does not include time in activities normally performed by clinical staff). Physician's time may include the following activities when performed: Preparing to see the patient (e.g., pre-charting review of records, searching for previously ordered imaging, lab work, and nerve conduction tests) Review of prior analgesic pharmacotherapies. Reviewing PMP Interpreting ordered tests (e.g., lab work, imaging, nerve conduction tests) Performing post-procedure evaluations, including interpretation of diagnostic procedures Obtaining and/or reviewing separately obtained history Performing a medically appropriate examination and/or evaluation Counseling and educating the patient/family/caregiver Ordering medications, tests, or procedures Referring and communicating with other health care professionals (when not separately reported) Documenting clinical information in the electronic or other health record Independently interpreting results (not separately reported) and communicating results to the patient/ family/caregiver Care coordination (not separately reported)  Note by: Cephus Collin, MD (TTS and AI technology used. I apologize for any typographical errors that were not detected and corrected.) Date: 12/24/2023; Time: 3:01 PM

## 2023-12-24 NOTE — Patient Instructions (Signed)
 ______________________________________________________________________    Preparing for your procedure  Appointments: If you think you may not be able to keep your appointment, call 24-48 hours in advance to cancel. We need time to make it available to others.  Procedure visits are for procedures only. During your procedure appointment there will be: NO Prescription Refills*. NO medication changes or discussions*. NO discussion of disability issues*. NO unrelated pain problem evaluations*. NO evaluations to order other pain procedures*. *These will be addressed at a separate and distinct evaluation encounter on the provider's evaluation schedule and not during procedure days.  Instructions: Food intake: Avoid eating anything solid for at least 8 hours prior to your procedure. Clear liquid intake: You may take clear liquids such as water up to 2 hours prior to your procedure. (No carbonated drinks. No soda.) Transportation: Unless otherwise stated by your physician, bring a driver. (Driver cannot be a Market researcher, Pharmacist, community, or any other form of public transportation.) Morning Medicines: Except for blood thinners, take all of your other morning medications with a sip of water. Make sure to take your heart and blood pressure medicines. If your blood pressure's lower number is above 100, the case will be rescheduled. Blood thinners: Make sure to stop your blood thinners as instructed.  If you take a blood thinner, but were not instructed to stop it, call our office [phone] and ask to talk to a nurse. Not stopping a blood thinner prior to certain procedures could lead to serious complications. Diabetics on insulin: Notify the staff so that you can be scheduled 1st case in the morning. If your diabetes requires high dose insulin, take only  of your normal insulin dose the morning of the procedure and notify the staff that you have done so. Preventing infections: Shower with an antibacterial soap the  morning of your procedure.  Build-up your immune system: Take 1000 mg of Vitamin C with every meal (3 times a day) the day prior to your procedure. Antibiotics: Inform the nursing staff if you are taking any antibiotics or if you have any conditions that may require antibiotics prior to procedures. (Example: recent joint implants)   Pregnancy: If you are pregnant make sure to notify the nursing staff. Not doing so may result in injury to the fetus, including death.  Sickness: If you have a cold, fever, or any active infections, call and cancel or reschedule your procedure. Receiving steroids while having an infection may result in complications. Arrival: You must be in the facility at least 30 minutes prior to your scheduled procedure. Tardiness: Your scheduled time is also the cutoff time. If you do not arrive at least 15 minutes prior to your procedure, you will be rescheduled.  Children: Do not bring any children with you. Make arrangements to keep them home. Dress appropriately: There is always a possibility that your clothing may get soiled. Avoid long dresses. Valuables: Do not bring any jewelry or valuables.  Reasons to call and reschedule or cancel your procedure: (Following these recommendations will minimize the risk of a serious complication.) Surgeries: Avoid having procedures within 2 weeks of any surgery. (Avoid for 2 weeks before or after any surgery). Flu Shots: Avoid having procedures within 2 weeks of a flu shots or . (Avoid for 2 weeks before or after immunizations). Barium: Avoid having a procedure within 7-10 days after having had a radiological study involving the use of radiological contrast. (Myelograms, Barium swallow or enema study). Heart attacks: Avoid any elective procedures or surgeries for the  initial 6 months after a "Myocardial Infarction" (Heart Attack). Blood thinners: It is imperative that you stop these medications before procedures. Let us know if you if you take  any blood thinner.  Infection: Avoid procedures during or within two weeks of an infection (including chest colds or gastrointestinal problems). Symptoms associated with infections include: Localized redness, fever, chills, night sweats or profuse sweating, burning sensation when voiding, cough, congestion, stuffiness, runny nose, sore throat, diarrhea, nausea, vomiting, cold or Flu symptoms, recent or current infections. It is specially important if the infection is over the area that we intend to treat. Heart and lung problems: Symptoms that may suggest an active cardiopulmonary problem include: cough, chest pain, breathing difficulties or shortness of breath, dizziness, ankle swelling, uncontrolled high or unusually low blood pressure, and/or palpitations. If you are experiencing any of these symptoms, cancel your procedure and contact your primary care physician for an evaluation.  Remember:  Regular Business hours are:  Monday to Thursday 8:00 AM to 4:00 PM  Provider's Schedule: Delano Metz, MD:  Procedure days: Tuesday and Thursday 7:30 AM to 4:00 PM  Edward Jolly, MD:  Procedure days: Monday and Wednesday 7:30 AM to 4:00 PM Last  Updated: 07/16/2023 ______________________________________________________________________    Epidural Steroid Injection Patient Information  Description: The epidural space surrounds the nerves as they exit the spinal cord.  In some patients, the nerves can be compressed and inflamed by a bulging disc or a tight spinal canal (spinal stenosis).  By injecting steroids into the epidural space, we can bring irritated nerves into direct contact with a potentially helpful medication.  These steroids act directly on the irritated nerves and can reduce swelling and inflammation which often leads to decreased pain.  Epidural steroids may be injected anywhere along the spine and from the neck to the low back depending upon the location of your pain.   After numbing  the skin with local anesthetic (like Novocaine), a small needle is passed into the epidural space slowly.  You may experience a sensation of pressure while this is being done.  The entire block usually last less than 10 minutes.  Conditions which may be treated by epidural steroids:  Low back and leg pain Neck and arm pain Spinal stenosis Post-laminectomy syndrome Herpes zoster (shingles) pain Pain from compression fractures  Preparation for the injection:  Do not eat any solid food or dairy products within 8 hours of your appointment.  You may drink clear liquids up to 3 hours before appointment.  Clear liquids include water, black coffee, juice or soda.  No milk or cream please. You may take your regular medication, including pain medications, with a sip of water before your appointment  Diabetics should hold regular insulin (if taken separately) and take 1/2 normal NPH dos the morning of the procedure.  Carry some sugar containing items with you to your appointment. A driver must accompany you and be prepared to drive you home after your procedure.  Bring all your current medications with your. An IV may be inserted and sedation may be given at the discretion of the physician.   A blood pressure cuff, EKG and other monitors will often be applied during the procedure.  Some patients may need to have extra oxygen administered for a short period. You will be asked to provide medical information, including your allergies, prior to the procedure.  We must know immediately if you are taking blood thinners (like Coumadin/Warfarin)  Or if you are allergic to IV  iodine contrast (dye). We must know if you could possible be pregnant.  Possible side-effects: Bleeding from needle site Infection (rare, may require surgery) Nerve injury (rare) Numbness & tingling (temporary) Difficulty urinating (rare, temporary) Spinal headache ( a headache worse with upright posture) Light -headedness  (temporary) Pain at injection site (several days) Decreased blood pressure (temporary) Weakness in arm/leg (temporary) Pressure sensation in back/neck (temporary)  Call if you experience: Fever/chills associated with headache or increased back/neck pain. Headache worsened by an upright position. New onset weakness or numbness of an extremity below the injection site Hives or difficulty breathing (go to the emergency room) Inflammation or drainage at the infection site Severe back/neck pain Any new symptoms which are concerning to you  Please note:  Although the local anesthetic injected can often make your back or neck feel good for several hours after the injection, the pain will likely return.  It takes 3-7 days for steroids to work in the epidural space.  You may not notice any pain relief for at least that one week.  If effective, we will often do a series of three injections spaced 3-6 weeks apart to maximally decrease your pain.  After the initial series, we generally will wait several months before considering a repeat injection of the same type.  If you have any questions, please call [phone] [facility]

## 2023-12-24 NOTE — Progress Notes (Signed)
 Safety precautions to be maintained throughout the outpatient stay will include: orient to surroundings, keep bed in low position, maintain call bell within reach at all times, provide assistance with transfer out of bed and ambulation.   Patient lives at [facility]

## 2023-12-26 ENCOUNTER — Ambulatory Visit: Admit: 2023-12-26 | Admitting: Ophthalmology

## 2023-12-26 SURGERY — PHACOEMULSIFICATION, CATARACT, WITH IOL INSERTION
Anesthesia: Topical | Laterality: Right

## 2024-01-01 ENCOUNTER — Ambulatory Visit: Admitting: Neurology

## 2024-01-01 ENCOUNTER — Encounter: Payer: Self-pay | Admitting: Neurology

## 2024-01-01 VITALS — BP 135/81 | HR 62 | Ht 67.0 in | Wt 151.0 lb

## 2024-01-01 DIAGNOSIS — G5603 Carpal tunnel syndrome, bilateral upper limbs: Secondary | ICD-10-CM

## 2024-01-01 DIAGNOSIS — M2550 Pain in unspecified joint: Secondary | ICD-10-CM | POA: Diagnosis not present

## 2024-01-01 DIAGNOSIS — G5622 Lesion of ulnar nerve, left upper limb: Secondary | ICD-10-CM

## 2024-01-01 NOTE — Progress Notes (Signed)
 [facility] Clinic Note - Initial Visit   Date: 01/01/2024   Lee Mueller MRN: [id number] DOB: 1966/02/21   Dear Lucetta Russel, PA-C:  Thank you for your kind referral of Lee Mueller for consultation of imbalance and leg tingling. Although his history is well known to you, please allow us  to reiterate it for the purpose of our medical record. The patient was accompanied to the clinic by self.    Lee Mueller is a 58 y.o. right-handed male with history of alcohol abuse presenting for evaluation of tingling legs and unsteadiness.   IMPRESSION/PLAN: Polyarthralgia with abnormal autoimmune markers (ANA, C3, anti-Ro, anti-TPO). He is scheduled to see rheumatology in September for evaluation.  Bilateral leg tingling has improved.  NCS/EMG of the legs was normal, without evidence of neuropathy.  Imaging of the lumbar spine does not show compressive pathology.  With leg symptoms improved, not additional testing is indicated.   History of alcohol abuse. Counseled patient to continue to abstain from alcohol.  Return to clinic as needed  ------------------------------------------------------------- History of present illness: Starting in earlier this year, he was having intermittent spells of tingling involving the back of the thighs.  Symptoms have improved and now only occurs occasionally when he walks long distances.  He has some imbalance when he first stands up, which lasts a few seconds and then resolves.  He does not have any imbalance when walking.  No falls.  He has extensive neurological evaluation including NCS/EMG legs (normal), NCS/EMG arms (mild bilateral CTS, left ulnar neuropathy at the elbow, mild-moderate), MRI brain (unremarkable), MRI cervical, thoracic and lumbar spine with multilevel biforaminal stenosis, no cord disease.    He complains of achy pain involving the shoulders, chest, and neck.  He is seeing pain management.  PCP checked labs which  was positive for ANA, anti-Ro, anti-TPO, and C3.  He is scheduled to see rheumatology in September.   Out-side paper records, electronic medical record, and images have been reviewed where available and summarized as:  NCS/EMG of the legs 11/14/2023: This is a normal study of the lower extremities.  In particular, there is no evidence of a large fiber sensorimotor polyneuropathy or lumbosacral radiculopathy.   NCS/EMG of the arms 11/15/2023: Left ulnar neuropathy with slowing across the elbow, demyelinating, mild-moderate. Bilateral median neuropathy at or distal to the wrist, consistent with a clinical diagnosis of carpal tunnel syndrome.  Overall, these findings are mild and worse on the left.   MRI brain and cervical spine wwo contrast 11/08/2022: 1. No acute intracranial process. No intracranial contrast enhancing lesion or abnormal contrast enhanacement in the cervical spine. 2. Multilevel cervical spine degenerative changes with up to moderate bilateral neural foraminal narrowing at C3-C4 and on the right at C5-C6. 3. Mild spinal canal stenosis at C3-C4, C4-C5, and C5-C6.  MRI lumbar spine wo contrast 11/11/2022: 1. L1-L2 mild spinal canal stenosis. No neural foraminal narrowing. 2. L4-L5 mild bilateral neural foraminal narrowing. 3. L5-S1 mild right neural foraminal narrowing. 4. Multilevel facet arthropathy, which can be a cause of back pain.  MRI cervical spine wo contrast 10/27/2023: 1. Small central disc protrusion at C3-4 without stenosis. 2. Small right paracentral disc protrusion at C4-5 with minimal cord flattening, but no cord signal changes or significant stenosis. 3. Mild left C4 and C5 foraminal stenosis, with mild right C6 foraminal narrowing related to disc bulge and uncovertebral disease. 4. Mild multilevel facet hypertrophy as detailed above, most pronounced at C4-5. Findings could contribute to neck pain.  MRI thoracic spine wo contrast 10/27/2023: 1. Small right  paracentral disc protrusions at T5-6, T6-7, and T8-9 without significant stenosis or neural impingement. 2. Otherwise minor for age spondylosis elsewhere within the thoracic spine as above. No other significant stenosis or neural impingement. 3. Normal MRI appearance of the thoracic spinal cord.   Lab Results  Component Value Date   VITAMINB12 375 11/08/2022   Lab Results  Component Value Date   TSH 2.097 11/08/2022   Lab Results  Component Value Date   ESRSEDRATE 7 08/19/2023    Past Medical History:  Diagnosis Date   Alcohol use    Elevated liver function tests    Finger clubbing    History of alcohol dependence (HCC)    History of alcohol withdrawal delirium    History of alcoholic hepatitis    Hypertension    Right sided weakness    Severe protein-calorie malnutrition (HCC)    Tremor of both hands     Past Surgical History:  Procedure Laterality Date   CATARACT EXTRACTION W/PHACO Left 12/12/2023   Procedure: PHACOEMULSIFICATION, CATARACT, WITH IOL INSERTION 6.30 00:45.7;  Surgeon: Trudi Fus, MD;  Location[facility];  Service: Ophthalmology;  Laterality: Left;   TYMPANOSTOMY TUBE PLACEMENT Bilateral    around age 58     Medications:  Outpatient Encounter Medications as of 01/01/2024  Medication Sig   amLODipine  (NORVASC ) 10 MG tablet Take 1 tablet (10 mg total) by mouth daily.   cyanocobalamin  1000 MCG tablet Take 1 tablet (1,000 mcg total) by mouth daily.   folic acid  (FOLVITE ) 1 MG tablet Take 1 tablet (1 mg total) by mouth daily.   gabapentin (NEURONTIN) 100 MG capsule Take 1 capsule (100 mg total) by mouth 3 (three) times daily for 10 days, THEN 2 capsules (200 mg total) 3 (three) times daily for 10 days, THEN 3 capsules (300 mg total) 3 (three) times daily.   Multiple Vitamin (MULTIVITAMIN WITH MINERALS) TABS tablet Take 1 tablet by mouth daily.   rosuvastatin  (CRESTOR ) 5 MG tablet Take 1 tablet (5 mg total) by mouth daily.   thiamine   (VITAMIN B1) 100 MG tablet Take 1 tablet (100 mg total) by mouth daily.   Vitamin D , Ergocalciferol , (DRISDOL ) 1.25 MG (50000 UNIT) CAPS capsule Take 1 capsule (50,000 Units total) by mouth every 7 (seven) days.   No facility-administered encounter medications on file as of 01/01/2024.    Allergies: No Known Allergies  Family History: Family History  Problem Relation Age of Onset   Breast cancer Mother    Cancer Maternal Grandmother        Found cancer in hip   Cancer Paternal Grandfather        Possible lung cancer    Social History: Social History   Tobacco Use   Smoking status: Every Day    Current packs/day: 0.50    Average packs/day: 0.5 packs/day for 40.2 years (20.1 ttl pk-yrs)    Types: Cigarettes    Start date: 11/04/1983   Smokeless tobacco: Never   Tobacco comments:    Smokes about a pack a day.         No alcohol for 8 months - 01/01/2024  Vaping Use   Vaping status: Never Used  Substance Use Topics   Alcohol use: Not Currently    Comment: 12/04/23- "221 days sober"   Drug use: Never   Social History   Social History Narrative   Are you right handed or left handed? Right Handed  Are you currently employed ? No    What is your current occupation?   Do you live at home alone? No    Who lives with you? Lives a [employer]    What type of home do you live in: 1 story or 2 story? One story building         Vital Signs:  BP 135/81   Pulse 62   Ht 5\' 7"  (1.702 m)   Wt 151 lb (68.5 kg)   SpO2 98%   BMI 23.65 kg/m    Neurological Exam: MENTAL STATUS including orientation to time, place, person, recent and remote memory, attention span and concentration, language, and fund of knowledge is normal.  Speech is not dysarthric.  CRANIAL NERVES: II:  No visual field defects.     III-IV-VI: Pupils equal round and reactive to light.  Normal conjugate, extra-ocular eye movements in all directions of gaze.  No nystagmus.  No ptosis.   V:  Normal  facial sensation.    VII:  Normal facial symmetry and movements.   VIII:  Normal hearing and vestibular function.   IX-X:  Normal palatal movement.   XI:  Normal shoulder shrug and head rotation.   XII:  Normal tongue strength and range of motion, no deviation or fasciculation.  MOTOR:  Motor strength is 5/5 throughout. No atrophy, fasciculations or abnormal movements.  No pronator drift.   MSRs:                                           Right        Left brachioradialis 2+  2+  biceps 2+  2+  triceps 2+  2+  patellar 2+  2+  ankle jerk 2+  2+  Hoffman no  no  plantar response down  down   SENSORY:  Normal and symmetric perception of light touch, pinprick, vibration, and temperature.  Romberg's sign absent.   COORDINATION/GAIT: Normal finger-to- nose-finger.  Intact rapid alternating movements bilaterally.  Able to rise from a chair without using arms.  Gait narrow based and stable. Stressed gait intact.  Unsteady with tandem gait.   Total time spent reviewing records, interview, history/exam, documentation, and coordination of care on day of encounter:  40 minutes    Thank you for allowing me to participate in patient's care.  If I can answer any additional questions, I would be pleased to do so.    Sincerely,    Wallis Vancott K. Lydia Sams, DO

## 2024-01-06 ENCOUNTER — Telehealth: Payer: Self-pay

## 2024-01-06 DIAGNOSIS — M47812 Spondylosis without myelopathy or radiculopathy, cervical region: Secondary | ICD-10-CM

## 2024-01-06 DIAGNOSIS — M5412 Radiculopathy, cervical region: Secondary | ICD-10-CM

## 2024-01-06 NOTE — Telephone Encounter (Signed)
 Insurance denied auth. He has not had 4 weeks of PT. Also needs documenting that he has tried 3 weeks of anti-inflammatory drugs.

## 2024-01-07 NOTE — Telephone Encounter (Signed)
 Patient is willing to do PT here at [facility]; Please put in referral. Thanks

## 2024-01-07 NOTE — Telephone Encounter (Signed)
 I called the patient and left a vm letting him know Dr. Rhesa Celeste would order PT if he wanted to do this.

## 2024-01-08 ENCOUNTER — Telehealth: Payer: Self-pay

## 2024-01-08 DIAGNOSIS — F17210 Nicotine dependence, cigarettes, uncomplicated: Secondary | ICD-10-CM

## 2024-01-08 DIAGNOSIS — Z122 Encounter for screening for malignant neoplasm of respiratory organs: Secondary | ICD-10-CM

## 2024-01-08 DIAGNOSIS — Z87891 Personal history of nicotine dependence: Secondary | ICD-10-CM

## 2024-01-08 NOTE — Telephone Encounter (Signed)
 Lung Cancer Screening Narrative/Criteria Questionnaire (Cigarette Smokers Only- No Cigars/Pipes/vapes)   Lee Mueller   SDMV:01/22/2024 at 12:00 pm        02-Jan-1966   LDCT: 01/24/2024 at 1:30 pm at [facility]    58 y.o.   Phone: [phone]  Lung Screening Narrative (confirm age 72-77 yrs Medicare / 50-80 yrs Private pay insurance)   Insurance information: Medicaid   Referring Provider:[facility]   This screening involves an initial phone call with a team member from our program. It is called a shared decision making visit. The initial meeting is required by  insurance and Medicare to make sure you understand the program. This appointment takes about 15-20 minutes to complete. You will complete the screening scan at your scheduled date/time.  This scan takes about 5-10 minutes to complete. You can eat and drink normally before and after the scan.  Criteria questions for Lung Cancer Screening:   Are you a current or former smoker? Current Age began smoking: 1   If you are a former smoker, what year did you quit smoking? Quit 1 year (within 15 yrs)   To calculate your smoking history, I need an accurate estimate of how many packs of cigarettes you smoked per day and for how many years. (Not just the number of PPD you are now smoking)   Years smoking 42 x Packs per day 1 = Pack years 42   (at least 20 pack yrs)   (Make sure they understand that we need to know how much they have smoked in the past, not just the number of PPD they are smoking now)  Do you have a personal history of cancer?  No    Do you have a family history of cancer? Yes Mother Breast Cancer, Grandmother and Grandfather with unknown cancer.   Are you coughing up blood?  No  Have you had unexplained weight loss of 15 lbs or more in the last 6 months? No  It looks like you meet all criteria.  When would be a good time for us  to schedule you for this screening?   Additional information:

## 2024-01-10 NOTE — Progress Notes (Deleted)
 Referring Physician:  Carlean Charter, DO [address] [place],  [place] [address]  Primary Physician:  Carlean Charter, DO  History of Present Illness: 01/10/2024 Mr. Ramell Wacha has a history of HTN, tremors of both hands, history of ETOH dependence. He is *** days sober!  I have been following him for neck pain, balance/dexterity issues, and constant LBP.   Cervical MRI with spondylosis and mild multilevel foraminal stenosis. No central stenosis.    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 Felipe Horton recommended he see rheumatology.    Thoracic MRI with no compression noted.    Lumbar MRI from last April shows right lateral disc and mild bilateral foraminal stenosis at L4-L5 and mild right foraminal stenosis L5-S1. LBP likely due to spondylosis.   He was to see Dr. Lateef for possible injections and neurology for balance/dexterity issues. Scheduled to see rheumatology on 04/03/24.   He is here for follow up.   Dr. Lateef started him on neurontin  and discussed cervical injection. Dr. Lydia Sams recommended he follow up with rheumatology and continue to abstain from alcohol.           Cervical MRI from last April did not show any significant central spinal stenosis. He has cervical spondylosis with multilevel foraminal stenosis.    Numbness/tingling in hands was suspicious for carpal tunnel.    Lumbar MRI from last April showed right lateral disc and mild bilateral foraminal stenosis at L4-L5 and mild right foraminal stenosis L5-S1. LBP likely due to spondylosis, this would not explain numbness/tingling in legs from knees down.   He was referred back to ortho for his bilateral shoulder pain and limited ROM. He has appointment on 11/14/23.   EMG of bilateral upper and lower extremities was ordered. They are scheduled for 11/14/23 and 11/15/23.   He is here to review his cervical and thoracic MRI  scans.   His symptoms are the same. He continues with constant neck pain with bilateral shoulder pain. No radicular pain in his arms, but he has pain in his wrists/hands. He continues with difficulty with his balance and dexterity issues with his hands. He is dropping things. He has numbness and tingling in hands that is worse at night. He notes his hands "lock up."  He has constant LBP with no leg pain, but he has constant numbness/tingling and weakness in both legs from knees into his feet. No pain in his thighs. No alleviating or aggravating factors.   He is taking flexeril  and mobic .   He smokes 1/2 PPD x 40 years.   Bowel/Bladder Dysfunction: none  Conservative measures:  Physical therapy: patient has not participated in PT for back or neck Multimodal medical therapy including regular antiinflammatories:  flexeril , meloxicam  Injections:  has not had epidural steroid injections  Past Surgery: no past surgical history  Jomes Giraldo has issues with hand dexterity and balance issues.   The symptoms are causing a significant impact on the patient's life.   Review of Systems:  A 10 point review of systems is negative, except for the pertinent positives and negatives detailed in the HPI.  Past Medical History: Past Medical History:  Diagnosis Date   Alcohol use    Elevated liver function tests    Finger clubbing    History of alcohol dependence (HCC)    History of alcohol withdrawal delirium    History of alcoholic hepatitis    Hypertension  Right sided weakness    Severe protein-calorie malnutrition (HCC)    Tremor of both hands     Past Surgical History: Past Surgical History:  Procedure Laterality Date   CATARACT EXTRACTION W/PHACO Left 12/12/2023   Procedure: PHACOEMULSIFICATION, CATARACT, WITH IOL INSERTION 6.30 00:45.7;  Surgeon: Trudi Fus, MD;  Location: [facility];  Service: Ophthalmology;  Laterality: Left;   TYMPANOSTOMY TUBE PLACEMENT  Bilateral    around age 58    Allergies: Allergies as of 01/15/2024   (No Known Allergies)    Medications: Outpatient Encounter Medications as of 01/15/2024  Medication Sig   amLODipine  (NORVASC ) 10 MG tablet Take 1 tablet (10 mg total) by mouth daily.   cyanocobalamin  1000 MCG tablet Take 1 tablet (1,000 mcg total) by mouth daily.   folic acid  (FOLVITE ) 1 MG tablet Take 1 tablet (1 mg total) by mouth daily.   gabapentin  (NEURONTIN ) 100 MG capsule Take 1 capsule (100 mg total) by mouth 3 (three) times daily for 10 days, THEN 2 capsules (200 mg total) 3 (three) times daily for 10 days, THEN 3 capsules (300 mg total) 3 (three) times daily.   Multiple Vitamin (MULTIVITAMIN WITH MINERALS) TABS tablet Take 1 tablet by mouth daily.   rosuvastatin  (CRESTOR ) 5 MG tablet Take 1 tablet (5 mg total) by mouth daily.   thiamine  (VITAMIN B1) 100 MG tablet Take 1 tablet (100 mg total) by mouth daily.   Vitamin D , Ergocalciferol , (DRISDOL ) 1.25 MG (50000 UNIT) CAPS capsule Take 1 capsule (50,000 Units total) by mouth every 7 (seven) days.   No facility-administered encounter medications on file as of 01/15/2024.    Social History: Social History   Tobacco Use   Smoking status: Every Day    Current packs/day: 0.50    Average packs/day: 0.5 packs/day for 40.2 years (20.1 ttl pk-yrs)    Types: Cigarettes    Start date: 11/04/1983   Smokeless tobacco: Never   Tobacco comments:    Smokes about a pack a day.         No alcohol for 8 months - 01/01/2024  Vaping Use   Vaping status: Never Used  Substance Use Topics   Alcohol use: Not Currently    Comment: 12/04/23- "221 days sober"   Drug use: Never    Family Medical History: Family History  Problem Relation Age of Onset   Breast cancer Mother    Cancer Maternal Grandmother        Found cancer in hip   Cancer Paternal Grandfather        Possible lung cancer    Physical Examination: There were no vitals filed for this visit.   Awake,  alert, oriented to person, place, and time.  Speech is clear and fluent. Fund of knowledge is appropriate.   Cranial Nerves: Pupils equal round and reactive to light.  Facial tone is symmetric.    No abnormal lesions on exposed skin.   Strength: Side Biceps Triceps Deltoid Interossei Grip Wrist Ext. Wrist Flex.  R 5 5 5 5 5 5 5   L 5 5 5 5 5 5 5    Side Iliopsoas Quads Hamstring PF DF EHL  R 5 5 5 5 5 5   L 5 5 5 5 5 5    He does not give good effort with strength testing in upper extremities, no gross weakness, but has diffuse give way weakness.   Reflexes are 3+ and symmetric at the biceps, brachioradialis, patella and achilles.   Hoffman's is  absent.  Clonus is not present.   Bilateral upper and lower extremity sensation is intact to light touch.   He has slow unsteady gait.  Medical Decision Making  Imaging: Cervical MRI dated 10/25/23:  FINDINGS: Alignment: Straightening of the normal cervical lordosis. Underlying trace dextroscoliosis. No significant listhesis.   Vertebrae: Vertebral body height maintained without acute or chronic fracture. Bone marrow signal intensity within normal limits. Few small benign hemangiomata noted. No worrisome osseous lesions. Minimal reactive marrow edema present about the left C4-5 facet due to facet arthritis. No other abnormal marrow edema.   Cord: Normal signal and morphology.   Posterior Fossa, vertebral arteries, paraspinal tissues: Unremarkable.   Disc levels:   C2-C3: Normal interspace. Mild left-sided facet spurring. No canal or foraminal stenosis.   C3-C4: Small central disc protrusion indents the ventral thecal sac (series 8, image 16). Mild bilateral facet hypertrophy with mild bilateral uncovertebral spurring. No significant spinal stenosis. Mild left C4 foraminal narrowing. Right neural foramen remains patent.   C4-C5: Small right paracentral disc protrusion indents the ventral thecal sac (series 9, image 21). Minimal  cord flattening without cord signal changes or significant spinal stenosis. Superimposed bilateral uncovertebral spurring with left-sided facet degeneration. Mild left C5 foraminal narrowing. Right neural foramen remains patent.   C5-C6: Mild disc bulge with uncovertebral spurring, slightly asymmetric to the right. Mild left-sided facet hypertrophy. No spinal stenosis. Mild right C6 foraminal narrowing. Left neural foramina remains patent.   C6-C7: Minimal disc bulge with uncovertebral spurring. No canal or foraminal stenosis.   C7-T1: Normal interspace. Mild left-sided facet hypertrophy. No canal or foraminal stenosis.   IMPRESSION: 1. Small central disc protrusion at C3-4 without stenosis. 2. Small right paracentral disc protrusion at C4-5 with minimal cord flattening, but no cord signal changes or significant stenosis. 3. Mild left C4 and C5 foraminal stenosis, with mild right C6 foraminal narrowing related to disc bulge and uncovertebral disease. 4. Mild multilevel facet hypertrophy as detailed above, most pronounced at C4-5. Findings could contribute to neck pain.     Electronically Signed   By: Virgia Griffins M.D.   On: 10/27/2023 23:50   Thoracic MRI scan dated 10/25/23:  FINDINGS: Alignment: Vertebral bodies normally aligned with preservation of the normal thoracic kyphosis. No listhesis.   Vertebrae: Vertebral body height maintained without acute or chronic fracture. Bone marrow signal intensity mildly heterogeneous but overall within normal limits. Few scattered benign hemangiomata noted. No worrisome osseous lesions. No abnormal marrow edema.   Cord:  Normal signal and morphology.   Paraspinal and other soft tissues: Paraspinous soft tissues within normal limits. T2 hyperintense simple right renal parapelvic cyst partially visualize, benign in appearance, no follow-up imaging recommended.   Disc levels:   T1-2:  Unremarkable.   T2-3:  Unremarkable.   T3-4: Disc desiccation without disc bulge. No canal or foraminal stenosis.   T4-5: Minimal endplate spurring without significant disc bulge. Mild right-sided facet hypertrophy. No stenosis.   T5-6: Disc desiccation. Tiny right paracentral disc protrusion with annular fissure minimally flattens the ventral thecal sac (series 20, image 17). Minimal right-sided facet hypertrophy. No spinal stenosis. Foramina remain patent.   T6-7: Small right paracentral disc protrusion with annular fissure minimally indents the ventral thecal sac (series 20, image 20). No significant spinal stenosis. Foramina remain patent.   T7-8: Chronic endplate Schmorl's node deformity at the superior endplate of T8 without disc bulge. No stenosis.   T8-9: Disc desiccation. Small central to right paracentral disc protrusion with annular fissure  minimally indents the ventral thecal sac (series 20, image 26). No spinal stenosis. Foramina remain patent.   T9-10: Unremarkable.   T10-11: Small chronic endplate Schmorl's node deformities without significant disc bulge. No stenosis.   T11-12: Normal interspace. Mild left-sided facet hypertrophy. No canal or foraminal stenosis.   T12-L1: Normal interspace. Mild left-sided facet hypertrophy. No canal or foraminal stenosis.   IMPRESSION: 1. Small right paracentral disc protrusions at T5-6, T6-7, and T8-9 without significant stenosis or neural impingement. 2. Otherwise minor for age spondylosis elsewhere within the thoracic spine as above. No other significant stenosis or neural impingement. 3. Normal MRI appearance of the thoracic spinal cord.     Electronically Signed   By: Virgia Griffins M.D.   On: 10/27/2023 23:59  I have personally reviewed the images and agree with the above interpretation.  Assessment and Plan: [name] has constant neck pain with bilateral shoulder pain. No radicular pain in his arms, but he has pain in his  wrists/hands. He has constant numbness and tingling in hands that is worse at night.    He continues with balance/dexterity issues.    Cervical MRI with spondylosis and mild multilevel foraminal stenosis. No central stenosis.    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    Thoracic MRI with no compression noted.    He has constant LBP with no leg pain, but he has constant numbness/tingling and weakness in both legs from knees into his feet.    Lumbar MRI from last April shows right lateral disc and mild bilateral foraminal stenosis at L4-L5 and mild right foraminal stenosis L5-S1. LBP likely due to spondylosis.    EMG of bilateral lower extremities was normal.    Treatment options discussed with patient and following plan made:    - PT for cervical and lumbar spine recommended. He declines.  - Referral to pain management (Lateef) to discuss cervical and lumbar injections.  - Recommend carpal tunnel splints at night. Can get OTC.  - Referral to Dr. Felipe Horton for bilateral CTS and left ulnar neuropathy. Appointment scheduled.  - Referral to neurology for balance/dexterity issues and numbness/tingling in legs that is not spine mediated. Will have him see [facility] Lydia Sams).  - Follow up with me in 6-8 weeks for follow up after cervical/lumbar injections.    Lucetta Russel PA-C Neurosurgery    I spent a total of *** minutes in face-to-face and non-face-to-face activities related to this patient's care today including review of outside records, review of imaging, review of symptoms, physical exam, discussion of differential diagnosis, discussion of treatment options, and documentation.   Lucetta Russel PA-C Dept. of Neurosurgery

## 2024-01-15 ENCOUNTER — Ambulatory Visit: Admitting: Orthopedic Surgery

## 2024-01-22 ENCOUNTER — Encounter

## 2024-01-22 ENCOUNTER — Encounter: Payer: Self-pay | Admitting: Emergency Medicine

## 2024-01-24 ENCOUNTER — Ambulatory Visit: Admission: RE | Admit: 2024-01-24 | Source: Ambulatory Visit

## 2024-02-03 ENCOUNTER — Ambulatory Visit: Admitting: Family Medicine

## 2024-03-20 NOTE — Progress Notes (Deleted)
 Office Visit Note  Patient: Lee Mueller             Date of Birth: [birth year]           MRN: [id number]             PCP: Donzella Lauraine SAILOR, DO Referring: Donzella Lauraine SAILOR, DO Visit Date: 04/03/2024 Occupation: @GUAROCC @  Subjective:  No chief complaint on file.   History of Present Illness: Dannon Nguyenthi is a 58 y.o. male ***  Component     Latest Ref Rng 08/19/2023  Anti-Nuclear Ab by IFA (RDL)     Negative  Positive !   Anti-Centromere Ab (RDL)     <1:40  <1:40   Anti-dsDNA Ab by Farr(RDL)     <8.0 IU/mL <8.0   Anti-Sm Ab (RDL)     <20 Units <20   Anti-U1 RNP Ab (RDL)     <20 Units <20   Anti-Ro (SS-A) Ab (RDL)     <20 Units 99 (H)   Anti-La (SS-B) Ab (RDL)     <20 Units <20   Anti-Scl-70 Ab (RDL)     <20 Units <20   Anti-Cardiolipin Ab, IgG (RDL)     <15 GPL U/mL <15   Anti-Cardiolipin Ab, IgA (RDL)     <12 APL U/mL <12   Anti-Cardiolipin Ab, IgM (RDL)     <13 MPL U/mL <13   C3 Complement (RDL)     90 - 180 mg/dL 817 (H)   C4 Complement (RDL)     14 - 44 mg/dL 31   Anti-TPO Ab (RDL)     <9.0 IU/mL 12.7 (H)   Anti-Chromatin Ab, IgG (RDL)     <20 Units <20   Anti-CCP Ab, IgG & IgA (RDL)     <20 Units <20   Rheumatoid Factor by Turb RDL     IU/mL CANCELED   Speckled Pattern     <1:40  1:80 (H)   Note: Comment   Sed Rate     0 - 30 mm/hr 7   RA Latex Turbid.     <14.0 IU/mL <10.0   Vitamin D, 25-Hydroxy     30.0 - 100.0 ng/mL 13.9 (L)     Legend: ! Abnormal (H) High (L) Low   Activities of Daily Living:  Patient reports morning stiffness for *** {minute/hour:19697}.   Patient {ACTIONS;DENIES/REPORTS:21021675::Denies} nocturnal pain.  Difficulty dressing/grooming: {ACTIONS;DENIES/REPORTS:21021675::Denies} Difficulty climbing stairs: {ACTIONS;DENIES/REPORTS:21021675::Denies} Difficulty getting out of chair: {ACTIONS;DENIES/REPORTS:21021675::Denies} Difficulty using hands for taps, buttons, cutlery, and/or writing:  {ACTIONS;DENIES/REPORTS:21021675::Denies}  No Rheumatology ROS completed.   PMFS History:  Patient Active Problem List   Diagnosis Date Noted   Cervical radicular pain 12/24/2023   Cervical spondylosis 12/24/2023   Lumbar radiculopathy 12/24/2023   Lumbar facet arthropathy 12/24/2023   Chronic pain syndrome 12/24/2023   Pain of both shoulder joints 11/27/2023   Follow-up exam, less than 3 months since previous exam 11/04/2023   Dyspnea on exertion 11/04/2023   Mixed hyperlipidemia 11/04/2023   Legally blind in left eye, as defined in [place]  11/04/2023   Multiple joint pain 08/24/2023   Low back pain radiating to both legs 08/24/2023   Neuroforaminal stenosis of cervical spine 08/24/2023   Neuroforaminal stenosis of lumbar spine 08/24/2023   Establishing care with new doctor, encounter for 08/24/2023   Moderately severe major depressive disorder, single episode (HCC) 08/24/2023   Acute metabolic encephalopathy 11/14/2022   Essential hypertension 11/14/2022   Protein-calorie malnutrition, severe (HCC) 11/13/2022   Right sided  weakness 11/10/2022   Vitamin D deficiency 11/09/2022   History of alcohol dependence (HCC) 11/09/2022   Complete loss of hair 11/08/2022   Finger clubbing 11/08/2022   Elevated liver enzymes 11/08/2022   Tremor of both hands 11/08/2022    Past Medical History:  Diagnosis Date   Alcohol use    Elevated liver function tests    Finger clubbing    History of alcohol dependence (HCC)    History of alcohol withdrawal delirium    History of alcoholic hepatitis    Hypertension    Right sided weakness    Severe protein-calorie malnutrition (HCC)    Tremor of both hands     Family History  Problem Relation Age of Onset   Breast cancer Mother    Cancer Maternal Grandmother        Found cancer in hip   Cancer Paternal Grandfather        Possible lung cancer   Past Surgical History:  Procedure Laterality Date   CATARACT EXTRACTION W/PHACO Left 12/12/2023    Procedure: PHACOEMULSIFICATION, CATARACT, WITH IOL INSERTION 6.30 00:45.7;  Surgeon: Enola Feliciano Hugger, MD;  Location: [facility];  Service: Ophthalmology;  Laterality: Left;   TYMPANOSTOMY TUBE PLACEMENT Bilateral    around age 47   Social History   Social History Narrative   Are you right handed or left handed? Right Handed   Are you currently employed ? No    What is your current occupation?   Do you live at home alone? No    Who lives with you? Lives a [employer]    What type of home do you live in: 1 story or 2 story? One story building        Immunization History  Administered Date(s) Administered   PNEUMOCOCCAL CONJUGATE-20 11/04/2023   Zoster Recombinant(Shingrix) 11/04/2023     Objective: Vital Signs: There were no vitals taken for this visit.   Physical Exam   Musculoskeletal Exam: ***  CDAI Exam: CDAI Score: -- Patient Global: --; Provider Global: -- Swollen: --; Tender: -- Joint Exam 04/03/2024   No joint exam has been documented for this visit   There is currently no information documented on the homunculus. Go to the Rheumatology activity and complete the homunculus joint exam.  Investigation: No additional findings.  Imaging: No results found.  Recent Labs: Lab Results  Component Value Date   WBC 6.3 11/09/2022   HGB 14.1 11/09/2022   PLT 235 11/09/2022   NA 139 08/19/2023   K 4.8 08/19/2023   CL 101 08/19/2023   CO2 22 08/19/2023   GLUCOSE 102 (H) 08/19/2023   BUN 13 08/19/2023   CREATININE 0.89 08/19/2023   BILITOT 0.5 08/19/2023   ALKPHOS 89 08/19/2023   AST 16 08/19/2023   ALT 12 08/19/2023   PROT 7.6 08/19/2023   ALBUMIN 5.0 (H) 08/19/2023   CALCIUM 9.8 08/19/2023    Speciality Comments: No specialty comments available.  Procedures:  No procedures performed Allergies: Patient has no known allergies.   Assessment / Plan:     Visit Diagnoses: SS-A antibody positive  ANA positive - 08/19/23: ANA  1:80 speckled, ESR 7, RF<10, Vit D 13.9, antiCCP-, chromatin ab-, TPO 12.7, C4 WNL, C3 182, anticardiolipin ab-, Scl-70-, SS-A 99, SS-B-, RNP-,SM-  Orders: No orders of the defined types were placed in this encounter.  No orders of the defined types were placed in this encounter.   Face-to-face time spent with patient was *** minutes.  Greater than 50% of time was spent in counseling and coordination of care.  Follow-Up Instructions: No follow-ups on file.   Waddell CHRISTELLA Craze, PA-C  Note - This record has been created using Dragon software.  Chart creation errors have been sought, but may not always  have been located. Such creation errors do not reflect on  the standard of medical care.

## 2024-04-03 ENCOUNTER — Encounter: Admitting: Rheumatology

## 2024-04-03 DIAGNOSIS — R768 Other specified abnormal immunological findings in serum: Secondary | ICD-10-CM

## 2024-04-22 ENCOUNTER — Encounter: Admitting: Rheumatology

## 2024-05-13 ENCOUNTER — Other Ambulatory Visit: Payer: Self-pay | Admitting: Family Medicine

## 2024-05-13 DIAGNOSIS — I10 Essential (primary) hypertension: Secondary | ICD-10-CM

## 2024-05-22 ENCOUNTER — Ambulatory Visit: Admitting: Rheumatology

## 2024-06-08 ENCOUNTER — Encounter: Payer: Self-pay | Admitting: Radiology
